# Patient Record
Sex: Female | Born: 1970
Health system: Southern US, Community
[De-identification: ages and names within clinical notes are randomized; demographics above are authoritative.]

## PROBLEM LIST (undated history)

## (undated) DIAGNOSIS — E079 Disorder of thyroid, unspecified: Secondary | ICD-10-CM

## (undated) DIAGNOSIS — L309 Dermatitis, unspecified: Secondary | ICD-10-CM

## (undated) DIAGNOSIS — E039 Hypothyroidism, unspecified: Secondary | ICD-10-CM

## (undated) HISTORY — PX: TONSILLECTOMY: SUR1361

## (undated) HISTORY — PX: THYROIDECTOMY: SHX17

## (undated) HISTORY — DX: Dermatitis, unspecified: L30.9

## (undated) HISTORY — DX: Disorder of thyroid, unspecified: E07.9

---

## 1998-06-25 ENCOUNTER — Emergency Department (HOSPITAL_COMMUNITY): Admission: EM | Admit: 1998-06-25 | Discharge: 1998-06-25 | Payer: Self-pay

## 1999-06-22 ENCOUNTER — Other Ambulatory Visit: Admission: RE | Admit: 1999-06-22 | Discharge: 1999-06-22 | Payer: Self-pay | Admitting: *Deleted

## 1999-07-12 ENCOUNTER — Encounter: Payer: Self-pay | Admitting: Obstetrics & Gynecology

## 1999-07-12 ENCOUNTER — Ambulatory Visit (HOSPITAL_COMMUNITY): Admission: RE | Admit: 1999-07-12 | Discharge: 1999-07-12 | Payer: Self-pay | Admitting: Obstetrics & Gynecology

## 1999-11-25 ENCOUNTER — Other Ambulatory Visit: Admission: RE | Admit: 1999-11-25 | Discharge: 1999-11-25 | Payer: Self-pay | Admitting: Obstetrics and Gynecology

## 1999-12-31 ENCOUNTER — Inpatient Hospital Stay (HOSPITAL_COMMUNITY): Admission: AD | Admit: 1999-12-31 | Discharge: 2000-01-04 | Payer: Self-pay | Admitting: *Deleted

## 2001-02-23 ENCOUNTER — Emergency Department (HOSPITAL_COMMUNITY): Admission: EM | Admit: 2001-02-23 | Discharge: 2001-02-23 | Payer: Self-pay | Admitting: Emergency Medicine

## 2001-02-23 ENCOUNTER — Encounter: Payer: Self-pay | Admitting: Emergency Medicine

## 2001-05-22 ENCOUNTER — Emergency Department (HOSPITAL_COMMUNITY): Admission: EM | Admit: 2001-05-22 | Discharge: 2001-05-22 | Payer: Self-pay | Admitting: Emergency Medicine

## 2001-05-27 ENCOUNTER — Encounter: Payer: Self-pay | Admitting: Emergency Medicine

## 2001-05-27 ENCOUNTER — Emergency Department (HOSPITAL_COMMUNITY): Admission: EM | Admit: 2001-05-27 | Discharge: 2001-05-27 | Payer: Self-pay | Admitting: Emergency Medicine

## 2001-12-05 ENCOUNTER — Other Ambulatory Visit: Admission: RE | Admit: 2001-12-05 | Discharge: 2001-12-05 | Payer: Self-pay | Admitting: Obstetrics and Gynecology

## 2002-06-18 ENCOUNTER — Emergency Department (HOSPITAL_COMMUNITY): Admission: EM | Admit: 2002-06-18 | Discharge: 2002-06-18 | Payer: Self-pay | Admitting: Emergency Medicine

## 2002-06-18 ENCOUNTER — Encounter: Payer: Self-pay | Admitting: Emergency Medicine

## 2002-12-08 ENCOUNTER — Emergency Department (HOSPITAL_COMMUNITY): Admission: EM | Admit: 2002-12-08 | Discharge: 2002-12-08 | Payer: Self-pay | Admitting: Emergency Medicine

## 2002-12-11 ENCOUNTER — Other Ambulatory Visit: Admission: RE | Admit: 2002-12-11 | Discharge: 2002-12-11 | Payer: Self-pay | Admitting: Obstetrics and Gynecology

## 2002-12-17 ENCOUNTER — Encounter: Admission: RE | Admit: 2002-12-17 | Discharge: 2002-12-17 | Payer: Self-pay | Admitting: Obstetrics and Gynecology

## 2002-12-17 ENCOUNTER — Encounter: Payer: Self-pay | Admitting: Obstetrics and Gynecology

## 2002-12-19 ENCOUNTER — Encounter: Payer: Self-pay | Admitting: Family Medicine

## 2002-12-19 ENCOUNTER — Encounter: Admission: RE | Admit: 2002-12-19 | Discharge: 2002-12-19 | Payer: Self-pay | Admitting: Family Medicine

## 2003-04-29 ENCOUNTER — Encounter (HOSPITAL_COMMUNITY): Admission: RE | Admit: 2003-04-29 | Discharge: 2003-07-28 | Payer: Self-pay | Admitting: Endocrinology

## 2003-05-01 ENCOUNTER — Encounter: Payer: Self-pay | Admitting: Endocrinology

## 2003-07-23 ENCOUNTER — Ambulatory Visit (HOSPITAL_COMMUNITY): Admission: RE | Admit: 2003-07-23 | Discharge: 2003-07-23 | Payer: Self-pay | Admitting: Family Medicine

## 2003-08-04 ENCOUNTER — Ambulatory Visit (HOSPITAL_COMMUNITY): Admission: RE | Admit: 2003-08-04 | Discharge: 2003-08-04 | Payer: Self-pay | Admitting: Otolaryngology

## 2003-08-04 ENCOUNTER — Encounter (INDEPENDENT_AMBULATORY_CARE_PROVIDER_SITE_OTHER): Payer: Self-pay | Admitting: *Deleted

## 2003-09-17 ENCOUNTER — Encounter (INDEPENDENT_AMBULATORY_CARE_PROVIDER_SITE_OTHER): Payer: Self-pay | Admitting: Specialist

## 2003-09-17 ENCOUNTER — Inpatient Hospital Stay (HOSPITAL_COMMUNITY): Admission: RE | Admit: 2003-09-17 | Discharge: 2003-09-18 | Payer: Self-pay | Admitting: Otolaryngology

## 2003-09-24 ENCOUNTER — Encounter: Admission: RE | Admit: 2003-09-24 | Discharge: 2003-09-24 | Payer: Self-pay | Admitting: Family Medicine

## 2003-12-21 ENCOUNTER — Other Ambulatory Visit: Admission: RE | Admit: 2003-12-21 | Discharge: 2003-12-21 | Payer: Self-pay | Admitting: Obstetrics and Gynecology

## 2004-01-06 ENCOUNTER — Encounter: Admission: RE | Admit: 2004-01-06 | Discharge: 2004-01-06 | Payer: Self-pay | Admitting: *Deleted

## 2004-12-23 ENCOUNTER — Other Ambulatory Visit: Admission: RE | Admit: 2004-12-23 | Discharge: 2004-12-23 | Payer: Self-pay | Admitting: Obstetrics and Gynecology

## 2005-10-13 ENCOUNTER — Encounter: Admission: RE | Admit: 2005-10-13 | Discharge: 2005-10-13 | Payer: Self-pay | Admitting: Family Medicine

## 2006-01-29 ENCOUNTER — Other Ambulatory Visit: Admission: RE | Admit: 2006-01-29 | Discharge: 2006-01-29 | Payer: Self-pay | Admitting: Obstetrics and Gynecology

## 2006-02-13 ENCOUNTER — Emergency Department (HOSPITAL_COMMUNITY): Admission: EM | Admit: 2006-02-13 | Discharge: 2006-02-13 | Payer: Self-pay | Admitting: Emergency Medicine

## 2006-06-15 ENCOUNTER — Encounter: Admission: RE | Admit: 2006-06-15 | Discharge: 2006-06-15 | Payer: Self-pay | Admitting: Obstetrics and Gynecology

## 2007-03-27 ENCOUNTER — Encounter: Admission: RE | Admit: 2007-03-27 | Discharge: 2007-03-27 | Payer: Self-pay | Admitting: Obstetrics and Gynecology

## 2007-06-25 ENCOUNTER — Encounter: Admission: RE | Admit: 2007-06-25 | Discharge: 2007-06-25 | Payer: Self-pay | Admitting: Emergency Medicine

## 2007-12-14 IMAGING — CR DG CHEST 2V
2 series · 2 of 2 positions shown · non-contrast
Comparison: none

CLINICAL DATA: Chest pain.
 CHEST X-RAY:
 Two views of the chest show the lungs to be clear.  There is peribronchial thickening which may indicate bronchitis.  The heart is within normal limits in size.

[w chest pa *]
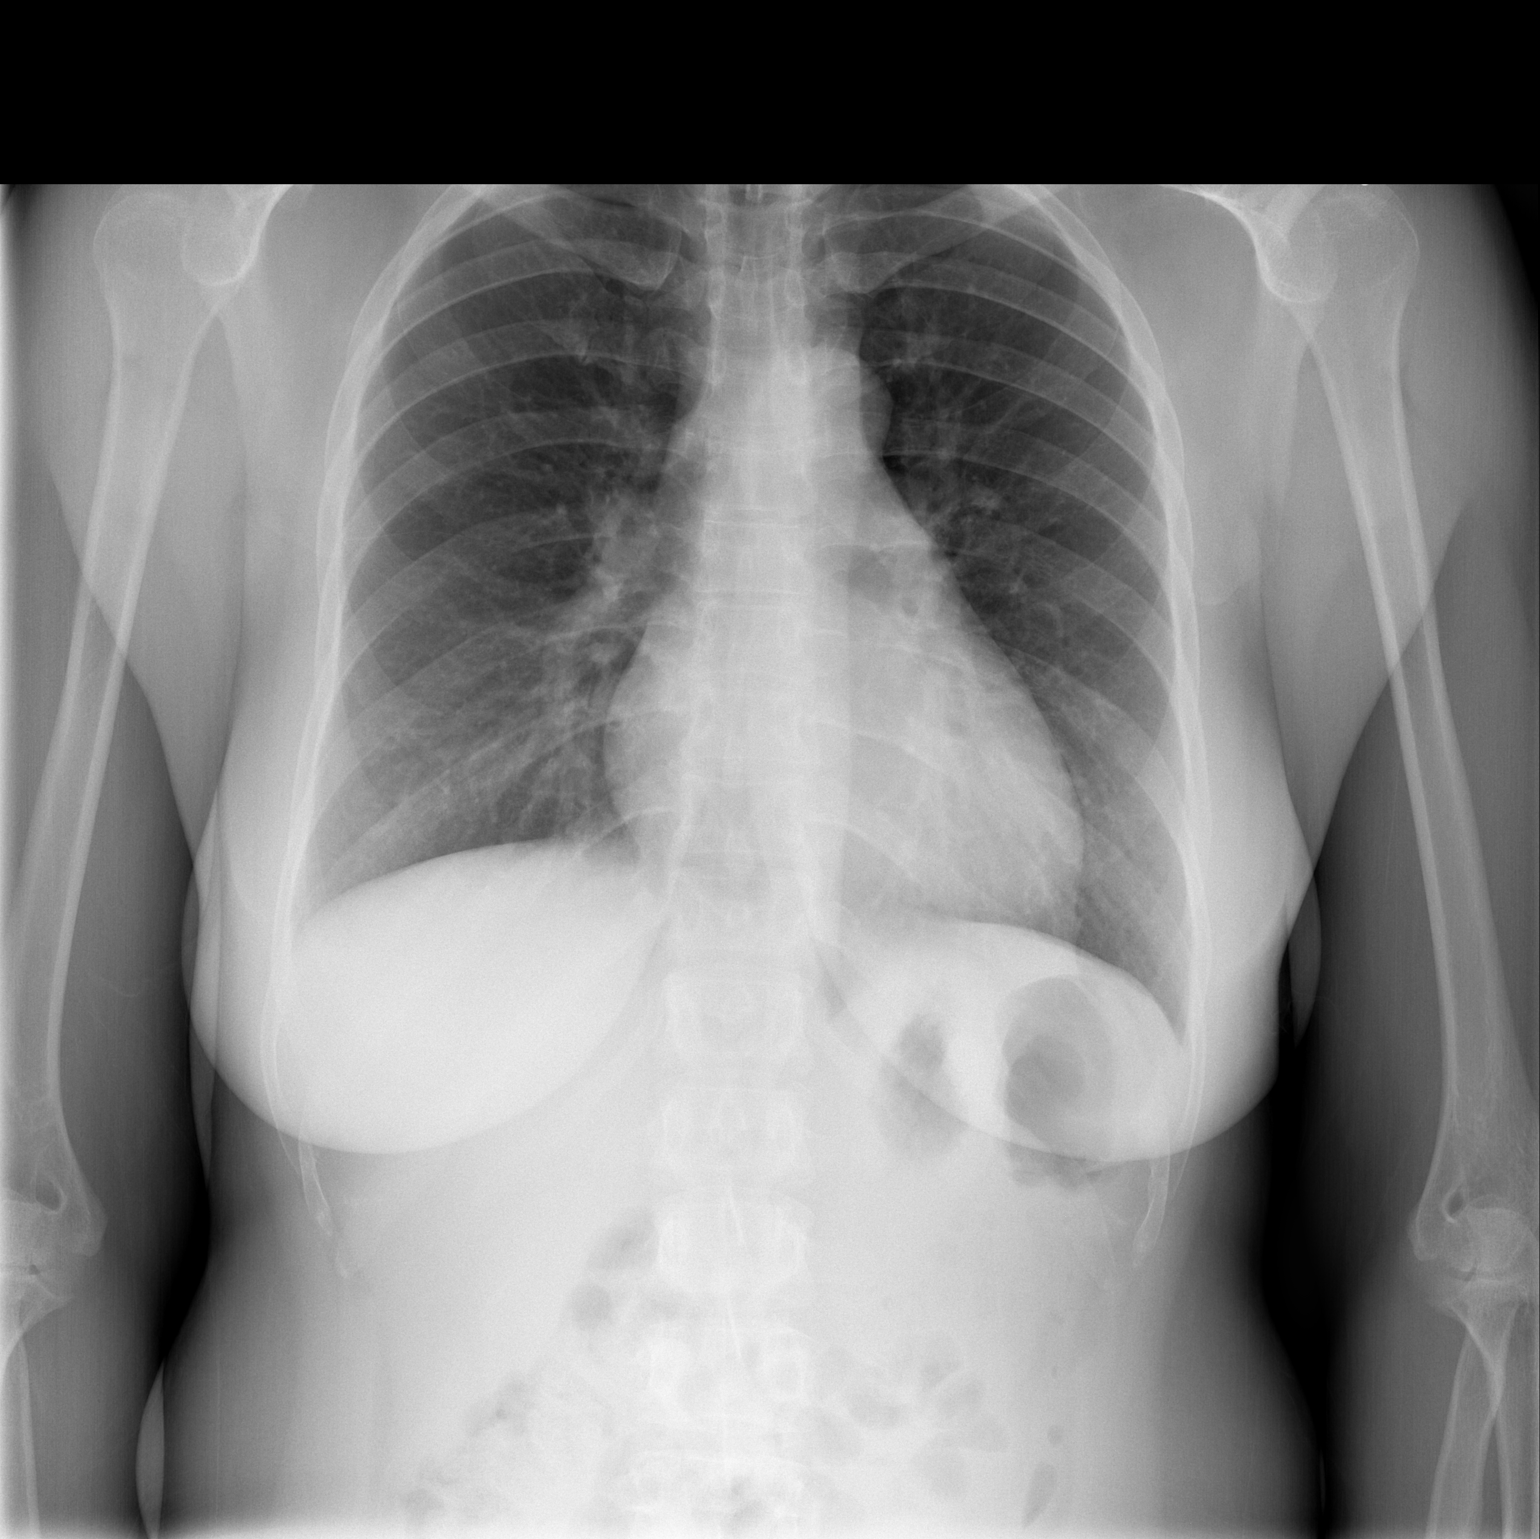

[w chest lat *]
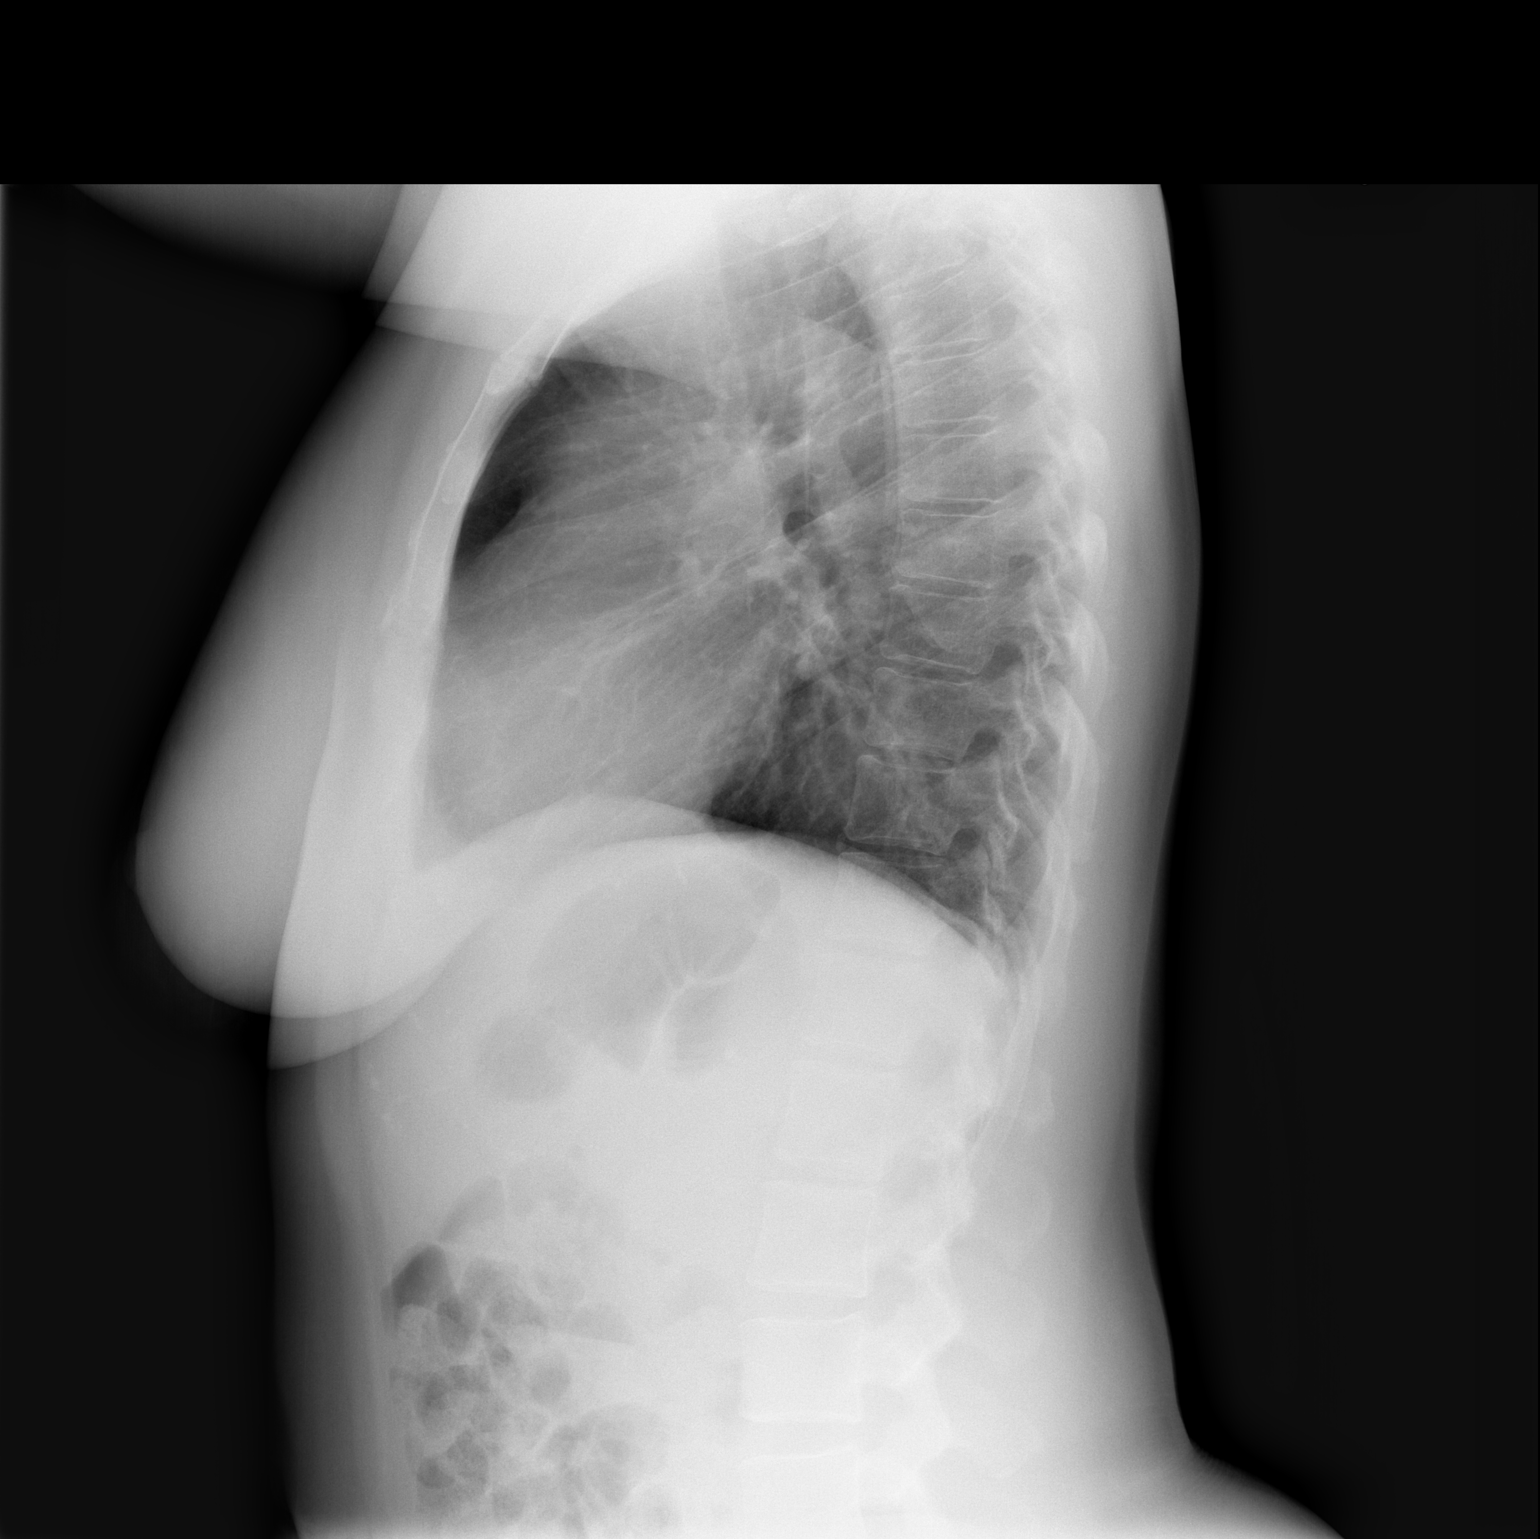

[2 of 2 positions shown; findings below may reference images not displayed]

IMPRESSION: No pneumonia.  Question bronchitis.

## 2008-04-22 ENCOUNTER — Encounter (INDEPENDENT_AMBULATORY_CARE_PROVIDER_SITE_OTHER): Payer: Self-pay | Admitting: Obstetrics and Gynecology

## 2008-04-22 ENCOUNTER — Inpatient Hospital Stay (HOSPITAL_COMMUNITY): Admission: AD | Admit: 2008-04-22 | Discharge: 2008-04-25 | Payer: Self-pay | Admitting: Obstetrics and Gynecology

## 2009-09-21 ENCOUNTER — Encounter: Admission: RE | Admit: 2009-09-21 | Discharge: 2009-09-21 | Payer: Self-pay | Admitting: Family Medicine

## 2010-04-05 ENCOUNTER — Encounter: Admission: RE | Admit: 2010-04-05 | Discharge: 2010-04-05 | Payer: Self-pay | Admitting: Obstetrics and Gynecology

## 2010-07-23 ENCOUNTER — Encounter: Payer: Self-pay | Admitting: Endocrinology

## 2010-07-24 ENCOUNTER — Encounter: Payer: Self-pay | Admitting: Obstetrics and Gynecology

## 2010-11-15 NOTE — Discharge Summary (Signed)
NAMECATHRYN, Sharon Moreno               ACCOUNT NO.:  [id number]   MEDICAL RECORD NO.:  [id number]          PATIENT TYPE:  INP   LOCATION:  9130                          FACILITY:  [facility]   PHYSICIAN:  Crist Fat. Rivard, M.D. DATE OF BIRTH:  02-01-1971   DATE OF ADMISSION:  04/22/2008  DATE OF DISCHARGE:  04/25/2008                               DISCHARGE SUMMARY   ADMISSION DIAGNOSES:  1. Intrauterine pregnancy at 38-3/7th weeks.  2. Vaginal bleeding, unknown etiology.  3. Previous cesarean section.  4. Tubal ligation.   DISCHARGE DIAGNOSES:  1. Intrauterine pregnancy at 38-3/7th weeks, delivered.  2. Desires sterilization.   PROCEDURES:  1. Repeat low-transverse cesarean section.  2. Bilateral tubal ligation.   HOSPITAL COURSE:  [name] is a 40 year old gravida 3, para 1-0-1-1  who was admitted at 38-3/7th weeks' gestation with vaginal bleeding.  They were 2 days prior to the day of admission.  At the time of office  visit prior to admission, clot was noted in the vagina.  1. The patient also with decreased fetal movement on the day of      admission as well.  Pregnancy remarkable for advanced maternal age.  2. Thyroid cancer and subsequent hypothyroidism.  3. Previous C-section for failure to progress.  4. Increased body mass index.  5. Desires bilateral tubal ligation.  6. Left breast fibroidectomy.   The patient was seen from the office for the above-noted issues.  At the  time of presentation MAU, moderate amount of bright red blood with  pooling in the vault was noted.  Fetal heart rate tracing was reactive  at that time of admission.  At that time, options for continued  observation, vaginal bleeding with admission or proceeding with cesarean  section discussed with the patient and Dr. Stefano Gaul.  At that time, the  patient desired to proceed with repeat cesarean section.  The patient  underwent a repeat low-transverse cesarean section and delivered a  viable female  infant, Rohan with a weight 6 pounds 11 ounces and Apgars of  8 and 9 at 1 and 5 minutes respectively.  No known etiology was noted at  the time of surgery for the vaginal bleeding noted prior to surgery.  Surgery was uncomplicated.  The patient also desired a bilateral tubal  ligation, which was performed as well.  Estimated blood loss was 700 mL.   On postoperative day #1, the patient was doing well and postoperative  hemoglobin noted to be 10.2.  On postoperative day #3, the patient was  ambulating, voiding, and tolerating p.o. liquids and solids without  difficulty.  [name] vital signs remained stable.  The patient was  afebrile.  It was felt the patient had received full benefit of her  hospital stay and was discharged to home.   DISCHARGE INSTRUCTIONS:  Per [facility] handout.   DISCHARGE MEDICATIONS:  1. Motrin 600 mg p.o. q.6 h. p.r.n. pain.  Of note, the patient stated      at one time that she was allergic to IBUPROFEN, but however, upon      further  questioning, the patient reports that she only experienced      nausea and has used ibuprofen in the past without other issues.  2. Tylox 1-2 tablets every 4-6 hours p.r.n. pain.   DISCHARGE FOLLOWUP:  Will occur at [facility] in 6 weeks.      Rhona Leavens, CNM      ______________________________  Crist Fat Rivard, M.D.    NOS/MEDQ  D:  04/25/2008  T:  04/25/2008  Job:  [id number]

## 2010-11-15 NOTE — H&P (Signed)
NAMEROGELIO, WINBUSH NO.:  [id number]   MEDICAL RECORD NO.:  [id number]          PATIENT TYPE:  MAT   LOCATION:  MATC                          [facility]ILITY:  [facility]   PHYSICIAN:  Janine Limbo, M.D.DATE OF BIRTH:  [birth year]   DATE OF ADMISSION:  04/22/2008  DATE OF DISCHARGE:                              HISTORY & PHYSICAL   Ms. Sharon Moreno is a 40 year old black female gravida 3, para 1-0-1-1, at 41  and 3/7 weeks for an [facility] of May 03, 2008, who was sent from the  office for complaint of vaginal bleeding over the last 2 days.  She  reports spotting with wiping yesterday and was seen in office for her  regularly schedule appointment yesterday with Dr. Stefano Gaul.  She  continued to have spotting with wiping yesterday evening.  When she  awoke this morning, there was no apparent vaginal bleeding when she used  the restroom.  As the day progressed today, the spotting returned, and  then she also noted some blood in the toilet.  She had not had any  vaginal bleeding that saturated her underwear until right before she was  worked in for an appointment at the office today.  She was seen in the  office by Theresia Lo, certified nurse midwife, and per her exam, there  was a clot the vagina and then red discharge on exam glove after  bimanual exam.  Her cervix was 1 cm, 50% and high.  The patient does  report decreased fetal movement today.  She is having occasional  contractions.  No leakage of fluid or watery discharge.  No other  complaints.  She has been followed by MD service at [facility].   HISTORY:  Remarkable for the following:  1. Advanced maternal age.  2. Thyroid cancer and subsequent hypothyroidism.  3. Previous C-section for failure to progress.  4. Increased BMI.  5. Desires bilateral tubal ligation.  6. Left breast fibroidectomy.   OBSTETRICAL HISTORY:  1. Gravida 1, C-section for failure to progress by Dr. Stefano Gaul, and      that was in July 2001, a female by  the name of Sharon Moreno weighing 8      pounds 8 ounces at 40 and 6/7 weeks after 10 hours of labor.  2. Gravida 2 was an elective abortion September 2006.  3. Gravida 3 is current pregnancy.   PAST MEDICAL HISTORY:  She does report an allergy to ASPIRIN.  Condoms  for birth control in the past.  She had a fibroadenoma that was removed  from her left breast at age 64.  Varicella as a child.  She reports  menarche at age 24 normal cycles, LMP August 04, 2007, giving her [facility]  of May 10, 2008.  However, her ultrasound on September 19, 2007, gave  best [facility] of May 03, 2008.  The patient had thyroid cancer at age 42  with subsequent thyroidectomy.  She also had tonsils and adenoids  removed at age 1.   FAMILY HISTORY:  Remarkable for maternal grandfather and cousin with  chronic hypertension.  Maternal aunts x2 and  maternal grandfather  diabetic, aunt on dialysis, cousin with seizures, genetic history  remarkable for patient age 89 and a cousin with mental retardation.   SOCIAL HISTORY:  Works full time in [employer], site, has had 16 years of  education, unaware father of baby's name; it not on her Optometrist.  The patient denied alcohol, tobacco or illicit drug use.   PRENATAL LABORATORIES:  Blood type is A positive, Rh antibody screen  negative.  Sickle cell negative, RPR nonreactive, rubella titer immune.  Hepatitis surface antigen negative, HIV nonreactive.  Cystic fibrosis  negative.  Hemoglobin October 10, 2007, was 12.1, hematocrit 36.3,  platelets 303.  Pap was within normal limits October 2008 and March  2009.  Gonorrhea and chlamydia cultures negative.  TSH at that time was  2.054.   HISTORY OF PRESENT PREGNANCY:  She entered care at [facility] October 10, 2007.  She was approximately 10 and 4/7 weeks' gestation.  She was  normotensive.  Her height was 5 feet 1 inch.  Her pregravid weight was  150 pounds.  At that time, her weight was 165 pounds.  She desired  originally Dr. Normand Sloop  as her primary, has now been followed by Dr.  Estanislado Pandy.  She did plan amnio secondary to advanced maternal age.  The  patient had the amnio scheduled for December 03, 2007.  Her endocrinologist,  Dr. Karen Kays, saw patient Nov 12, 2007.  Her TSH was 3.781.  T4 was 10.5.  At 18 and 2/7 weeks on December 03, 2007, she had an  ultrasound, single intrauterine pregnancy; size was equal to dates,  anterior placenta; adjusted risk was 1/590 from 1/295.  At that time,  the patient did decline amnio.  At that time, the patient also desired  repeat C-section as well as BTL.  She had a 1-hour Glucola at 26 and 3/7  weeks.  Hemoglobin at that time was 10.8.  Her weight at that time was  172-1/2 pounds, and plan was made for ultrasound for growth.  TSH was  drawn, and it was 1.59.  The 1-hour GTT was within normal limits equal  to 106.  She did have a low transverse cesarean section repeat scheduled  for May 01, 2008, at 9:30 a.m. with Dr. Stefano Gaul.  Hemoglobin was  drawn at 36 and 4/7 weeks on April 09, 2008.  Hemoglobin was 11.8.  TSH  was normal.  GBS negative.  She was 1 cm and 50% at 37 and 5/7 weeks;  see HPI for her most recent exams at the office leading to her  presentation today with the bleeding complaint from yesterday and today.   OBJECTIVE:  VITAL SIGNS ON ADMISSION:  Her blood pressure was 110/68,  heart rate 78, respirations 22.  Fetal heart rate reactive with moderate  variability, 130 baseline, very brief mild variable lasting less than 10  seconds with minimal nadir to 110.  Toco showed uterine contractions  every 7-12 minutes with irritability.  GENERAL:  No acute distress, alert and oriented x3, very pleasant.  CARDIOVASCULAR:  Regular rate and rhythm without murmur.  LUNGS:  Clear to auscultation bilaterally.  ABDOMEN:  Soft, nontender, gravid.  PELVIC:  Sterile spec exam showed small to moderate amount of bright to  dark red blood pooling in vault, not with mucus.  Her  cervix was 1+, 70%  and high.  EXTREMITIES:  She has mild generalized edema in bilateral lower  extremities.  No clonus and DTRs 1+.   IMPRESSION:  1. Intrauterine pregnancy at 80 and 3/7 weeks.  2. Third trimester vaginal bleeding.  3. Desire for repeat cesarean section and bilateral tubal ligation.  4. Reactive fetal heart tracing.   PLAN:  Dr. Stefano Gaul was at bedside and discussed with the patient  options of the following:  1. Admit for observation secondary to vaginal bleeding or #2.  2. Proceed with repeat cesarean for delivery.  The risks, benefits,      alternatives of both were discussed with the patient as far as risk      for cesarean section including but not limited to risk of bleeding,      infection and damage to      surrounding organs.  Following discussion with her family, patient      is agreeable to proceed with cesarean section for delivery and BTL.  3. Routine preop orders and will prepare for repeat cesarean section      and tubal.      Sharon Moreno, CNM      Janine Limbo, M.D.  Electronically Signed    CHS/MEDQ  D:  04/22/2008  T:  04/22/2008  Job:  [id number]

## 2010-11-15 NOTE — Op Note (Signed)
Sharon Moreno, ALPERIN NO.:  [id number]   MEDICAL RECORD NO.:  [id number]          PATIENT TYPE:  INP   LOCATION:  9130                          [facility]ACILITY:  [facility]   PHYSICIAN:  Janine Limbo, M.D.DATE OF BIRTH:  Dec 15, 1970   DATE OF PROCEDURE:  04/22/2008  DATE OF DISCHARGE:                               OPERATIVE REPORT   PREOPERATIVE DIAGNOSES:  1. Term intrauterine pregnancy.  2. Vaginal bleeding.  3. Prior cesarean section.  4. Desires sterilization.   POSTOPERATIVE DIAGNOSES:  1. Term intrauterine pregnancy.  2. Vaginal bleeding.  3. Prior cesarean section.  4. Desires sterilization.   PROCEDURE:  Repeat low transverse cesarean section and bilateral tubal  ligation.   SURGEON:  Janine Limbo, MD   FIRST ASSISTANT:  Candice Denny Levy, Certified Nurse Midwife.   ANESTHETIC:  Spinal.   DISPOSITION:  Ms. Sharon Moreno is a 40 year old female, gravida 3, para 1-0-1-  1, who presents at 38-1/[redacted] weeks gestation (EDC is May 03, 2008).  The patient has been followed at the [facility] and  Gynecology Division of [facility] for women.  This pregnancy  has been largely uncomplicated.  The patient does report that she has  had vaginal bleeding for the past 2-3 days.  She has presented to the  office for evaluation and indeed the bleeding is noted to be more than  would be expected for normal pregnancy changes.  No etiology is known  for the vaginal bleeding.  The nonstress test is reactive.  We discussed  our management options and the risk and benefits associated with each of  those options.  The patient is scheduled for repeat cesarean section in  8 days.  She carefully considered her options and she decided to proceed  with cesarean delivery at this time.  The patient understands the  indications for her surgical procedure and she accepts the risks, but  not limited to, anesthetic complications, bleeding, infection,  possible  damage to surrounding organs, and possible tubal failure (17 per 1000).   FINDINGS:  A 6 pounds and 11 ounces female infant (Sharon Moreno) was delivered  from a right occiput transverse presentation.  The Apgar scores were 8  at 1 minute and 9 at 5 minutes.  The uterus, fallopian tubes, and the  ovaries were normal for the gravid state.  There still no etiology known  for the vaginal bleeding.   PROCEDURE:  The patient was taken to the operating room where a spinal  anesthetic was given.  [name] abdomen, perineum, and outer vagina  were prepped with multiple layers of Betadine.  A Foley catheter was  placed in the bladder.  The patient was then sterilely draped.  The  lower abdomen was injected with 10 mL of 0.5% Marcaine with epinephrine.  A low transverse incision was made in the abdomen and the incision was  extended sharply through the subcutaneous tissue, fascia, and the  anterior peritoneum.  Large varicose veins were noted on the lower  uterine segment.  For that reason, we went slightly higher on the uterus  and  made a low transverse incision.  The fetal head was delivered.  The  mouth and nose were suctioned.  A nuchal cord was reduced.  The  remainder of the infant was then delivered.  The cord was clamped and  cut and the infant was handed to the waiting pediatric team.  Routine  cord blood studies were obtained.  The placenta was removed.  The  placenta was sent to labor and delivery.  The uterine cavity was cleaned  of amniotic fluid, clotted blood, and membranes.  The uterine incision  was closed using a running locking suture of 2-0 Vicryl followed by  imbricating suture of 2-0 Vicryl.  Hemostasis was noted to be adequate.  The pelvis was vigorously irrigated.  The left fallopian tube was  identified and followed to its fimbriated end.  A knuckle of tube was  made on the left using a free tie and then a suture ligature of 0 plain  catgut.  The knuckle of tube thus  made was excised.  Hemostasis was  adequate.  An identical procedure was carried out on the opposite side.  Again hemostasis was adequate.  All instruments were removed.  The  anterior peritoneum and abdominal musculature were closed using a  running suture of 0 Vicryl.  The fascia was closed using a running  suture of 0 Vicryl followed by three interrupted sutures of 0 Vicryl.  The subcutaneous layer was closed using a running suture of 0 Vicryl.  The skin was reapproximated using a subcuticular suture of 3-0 Monocryl.  Sponge, needle, instrument counts were correct on two occasions.  Estimated blood loss for the procedure was 700 mL.  The patient  tolerated her procedure well.  She was taken to the recovery room in  stable condition.  The infant was taken to the full-term nursery in  stable condition.  The fallopian tubes that were cut, were sent to  pathology for evaluation.      Janine Limbo, M.D.  Electronically Signed     AVS/MEDQ  D:  04/22/2008  T:  04/23/2008  Job:  [id number]

## 2010-11-18 NOTE — Op Note (Signed)
[facility]  Patient:    Sharon Moreno, Sharon Moreno                        MRN: [id number] Proc. Date: 01/01/00 Adm. Date:  [id number] Attending:  Leonard Schwartz                           Operative Report  PREOPERATIVE DIAGNOSES:       1. Term intrauterine pregnancy.                               2. Failure to progress in labor.                               3. Non-reassuring fetal heart rate tracing.  POSTOPERATIVE DIAGNOSES:      1. Term intrauterine pregnancy.                               2. Failure to progress in labor.                               3. Non-reassuring fetal heart rate tracing.  PROCEDURE:                    Primary low transverse cesarean section.  SURGEON:                      Janine Limbo, M.D.  FIRST ASSISTANT:              Nigel Bridgeman, C.N.M.  ANESTHESIA:                   Epidural.  DISPOSITION:                  Sharon Moreno is a 40 year old female, gravida 1, para 0, who presents at 40 weeks and 5 days gestation.  She started her labor on December 31, 1999 and dilated her cervix to 8 cm.  She progressed no further in spite of Pitocin, maintaining greater than 200 [place] units for approximately three hours.  The patient understands the indications for her procedure and she accepts the risks of, but not limited to, anesthetic complications, bleeding, infections and possible damage to the surrounding organs.  The patient began having deep variable decelerations as we prepared for cesarean delivery.  FINDINGS:                     An 8-pound 8-ounce female infant Sharon Moreno) was delivered from a left occipitotransverse position.  The Apgars were 5 at one minute and 9 at five minutes.  The uterus, fallopian tubes and ovaries appeared normal for the gravid state.  DESCRIPTION OF PROCEDURE:     The patient was taken to the operating room where it was determined that the epidural she had received for labor would be adequate  for a cesarean delivery.  A Foley catheter had previously been placed.  The patients abdomen was prepped with multiple layers of Betadine and then sterilely draped.  A low transverse incision was made in the abdomen and carried sharply through the subcutaneous tissue, the fascia and the anterior peritoneum.  An  incision was made in the lower uterine segment and extended transversely.  The fetal head was delivered.  The mouth and nose were suctioned.  The remainder of the infant was delivered and the infant was handed to the awaiting pediatric team.  Routine cord blood studies were obtained.  The placenta was removed.  The uterine cavity was cleaned of amniotic fluid, clotted blood and membranes.  The uterine cavity was irrigated.  The incision was closed using a running-locking suture of 2-0 Vicryl.  Hemostasis was adequate.  The paracolic gutters were cleaned of amniotic fluid and clotted blood.  The paracolic gutters were vigorously irrigated.  Hemostasis was confirmed once again.  The anterior peritoneum and the abdominal musculature were reapproximated in the midline.  The fascia was irrigated.  The fascia was closed using a running suture of 0 Vicryl, followed by three interrupted sutures of 0 Vicryl.  The subcutaneous tissue was irrigated.  Hemostasis was adequate.  The subcutaneous layer was closed using a running suture of 2-0 Vicryl.  The skin was reapproximated using skin staples.  Sponge, needle and instrument counts were correct on two occasions. The estimated blood loss was 600 cc.  The patient tolerated her procedure well.  She was taken to the recovery room in stable condition.  The infant was taken to the full-term nursery in stable condition. DD:  01/01/00 TD:  01/02/00 Job: [id number]

## 2010-11-18 NOTE — H&P (Signed)
[facility]  Patient:    Sharon Moreno, Sharon Moreno                        MRN: [id number] Adm. Date:  12/31/99 Attending:  Janine Limbo, M.D. Dictator:   Erin Sons, C.N.M.                         History and Physical  HISTORY OF PRESENT ILLNESS:   Sharon Moreno is a 40 year old, gravida 1, para 0, t 40-5/7 weeks who presents with uterine contractions every two to three minutes or the last several hours.  She was seen in the office yesterday and was 2 cm and er membranes were stripped.  She appears to be very uncomfortable with her contractions.  Pregnancy has been remarkable for:  1) History of thyroid cancer  with the patient on Synthroid during her pregnancy.  2) UTIs.  3) Left breast masses.  4) History of abnormal Pap with a plan to repeat the Pap postpartum.  PRENATAL LABORATORY DATA:     Blood type is A positive, rh antibody negative, VDRL nonreactive, rubella titer positive, hepatitis B surface antigen negative, HIV nonreactive, sickle cell test negative.  Pap showed low grade SIL with yeast. SH was done q.trimester.  First trimester was 18.39, second trimester 10.777, third trimester not documented on the patients current prenatal record.  Group B Strep culture was negative at 36 weeks.  Hemoglobin upon entry into practice was 11.8. It was 10 at 26 weeks.  EDC of December 26, 1999, was established by ultrasound at approximately 18 weeks.  HISTORY OF PRESENT PREGNANCY: The patient entered care at approximately 13 weeks. She had a left breast nodule noted.  She had an ultrasound of the left breast. She had a colposcopy at 17 weeks which showed no evidence of high grade SIL.  Pap was repeated at 30 weeks and a plan to be made to repeat it postpartum.  The patient has been followed by Gretta Arab. Valentina Lucks, M.D. for TSH levels.  Synthroid was managed by her.  She had the left breast mass evaluated and was determined to be a fibroadenoma.  The  rest of the patients pregnancy was essentially uncomplicated.  PAST OBSTETRIC HISTORY:       The patient is a primigravida.  PAST MEDICAL HISTORY:         She had thyroid cancer diagnosed in 1998 and her thyroid was removed at that time.  She has a history of varicose veins.  She had asthma as a teenager, but has had no problems since that time.  She is hypothyroid after the removal of her thyroid gland.  She had one urinary tract infection in  December.  PAST SURGICAL HISTORY:        Her tonsils removed in third grade. Thyroidectomy in 1998.  ALLERGIES:                    ASPIRIN.  FAMILY HISTORY:               Maternal grandfather had hypertension.  Her maternal grandfather had type 2 diabetes.  Maternal aunt had type 2 diabetes.  Maternal grandmother had kidney failure.  Maternal cousin had seizures.  Maternal grandfather had arthritis.  GENETIC HISTORY:              Remarkable for maternal cousin being mentally retarded.  SOCIAL HISTORY:  The patient is single.  The father of the baby is not currently present with her.  The patient is college educated.  She is employed Teacher, adult education.  Her partner is a IT trainer.  She has been followed by the physician service at [facility] Gynecology.  She is African-American and of he [address] faith.  She denies any alcohol, drug, or tobacco use during this pregnancy.  PHYSICAL EXAMINATION:  VITAL SIGNS:                  Stable.  The patient is afebrile.  HEENT:                        Within normal limits.  LUNGS:                        Bilateral breath sounds are clear.  HEART:                        Regular rate and rhythm without murmur.  BREASTS:                      Soft and nontender.  ABDOMEN:                      Fundal height is approximately 38 cm.  Estimated fetal weight is 7 to 7-1/2 pounds.  Uterine contractions are every five minutes of moderate quality.  PELVIC:                        Cervical examination 3 to 4 cm, 90%, and vertex at -2 to -3 station with intact bag of water.  Fetal heart rate is reassuring, but nonreactive with occasional mild variables.  EXTREMITIES:                  Deep tendon reflexes are 2+ without clonus. There is a trace edema noted.  IMPRESSION:                   1. Intrauterine pregnancy at 40-5/7 weeks.                               2. Early labor.                               3. History of thyroid cancer.  PLAN:                         1) Admit to birthing suite per consult with Janine Limbo, M.D. as attending physician.  2) Routine physician orders.  3) The patient may Stadol or an epidural p.r.n.  4) Anticipate normal spontaneous vaginal delivery.  5) M.D. will follow. DD:  12/31/99 TD:  12/31/99 Job: [id number]

## 2010-11-18 NOTE — Op Note (Signed)
NAME:  Sharon Moreno, Sharon Moreno                         ACCOUNT NO.:  [id number]   MEDICAL RECORD NO.:  [id number]                   PATIENT TYPE:  INP   LOCATION:  5705                                 FACILITY:  [facility]   PHYSICIAN:  Kinnie Scales. Annalee Genta, M.D.            DATE OF BIRTH:  30-Nov-1970   DATE OF PROCEDURE:  09/17/2003  DATE OF DISCHARGE:  09/18/2003                                 OPERATIVE REPORT   PREOPERATIVE DIAGNOSES:  1. 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  2. History of thyroid carcinoma.  3. Status post total thyroidectomy in 1999.   POSTOPERATIVE DIAGNOSES:  1. 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  2. History of thyroid carcinoma.  3. Status post total thyroidectomy in 1999.   PROCEDURE:  Left paratracheal dissection.   SURGEON:  Kinnie Scales. Annalee Genta, M.D.   ASSISTANTEnrigue Catena H. Pollyann Kennedy, M.D.   ANESTHESIA:  General endotracheal with NIMS monitoring.   INDICATIONS:  1. 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  2. History of thyroid carcinoma.  3. Status post total thyroidectomy in 1999.   ESTIMATED BLOOD LOSS:  50 mL.   COMPLICATIONS:  None.   DISPOSITION:  The patient was transferred from the operating room to the  recovery room in stable condition.   INDICATIONS:  Sharon Moreno is a 40 year old black female who was referred by  her endocrinologist, Dr Talmage Nap, for evaluation of possible recurrent  papillary carcinoma of the thyroid.  The patient had undergone a total  thyroidectomy in 1999.  Subsequently, she was followed closely by her  endocrinologist and treated with radioactive iodine therapy without  significant uptake.  Over the last six months, the patient has had gradually  increasing thyroglobulin levels.  Ultrasound of the neck showed a possible  0.5 cm masses along the left peritracheal region consistent with current  thyroid malignancy.  Given the patient's history and examination, ultrasound  guided  needle biopsy was performed.  Biopsy results showed rare atypical  cells suspicious for recurrent papillary carcinoma.  Given that history and  examination which showed no evidence of palpable mass or abnormality, I  recommended to undertake an exploration of the neck and dissection of the  left paratracheal region and possible zone 6 neck dissection.  The risks,  benefits and possible complications of the procedure were discussed in  detail with the patient and her family and they understood and concurred  with our plan for surgery which is scheduled on September 17, 2003.   DESCRIPTION OF PROCEDURE:  The patient was brought to the operating room on  September 17, 2003, and placed in the supine position on the operating table.  General endotracheal anesthesia was established without difficulty using a  Zomed NIMS endotracheal tube (nerve integrity monitoring system).  With the  patient adequately anesthetized, she was injected with 3 mL of 1% lidocaine  1:100,000 solution epinephrine in the proposed incision site.  The patient  was then prepped and draped in a sterile fashion.  A 5 cm horizontally-  oriented low neck incision was created with a #15 scalpel blade.  This was  carried through the patient's previously existing thyroid incision.  The  skin and subcutaneous tissue were incised and subcutaneous flaps were  elevated superiorly and inferiorly.  The latissimus  muscle was identified  and divided, and subplatysmal flaps were elevated in order to gain access to  the anterior neck.  The cricoid and tracheal cartilage were palpated and  midline was identified.  There was significant scarring from previous  surgery.  The left paratracheal region was then explored.  Adherent strap  muscles were elevated from the trachea and this allowed access to the left  tracheal esophageal groove.  After dissecting carefully in that area an  inferior parathyroid gland was identified.  This was confirmed with  biopsy  leaving the majority of the gland intact.  The recurrent laryngeal nerve was  identified, and this was confirmed using the NIMS monitor.  This was  followed inferiorly and superiorly dissecting from the surrounding tissues.  With the nerve and parathyroid gland identified, dissection was then carried  out in the left paratracheal region.  There was no discrete palpable mass or  other abnormality.  Dissection was carried out between the trachea and the  medial aspect of the carotid artery.  An approximately 2 x 3 cm region of  tissue was then dissected.  Deep to that area, the parapharyngeal and  superior esophageal musculature was identified and preserved.  Dissection  involved the paratracheal tissues and these were sent for pathology for  gross microscopic evaluation.  [name] wound was then irrigated with  saline solution.  Several areas of point hemorrhage were cauterized with  bipolar cautery.  At the conclusion of the procedure, the recurrent  laryngeal nerve was stimulated and was intact.  The wound was then closed in  multiple layers consisting of reapproximation of the strap muscles with 3-0  Vicryl suture.  The same suture was used to close the platysmal on the left  hand side.  Subcutaneous tissue was closed with a 4-0 Vicryl suture and  final skin closure with a running locked 5-0 Ethilon suture.  The patient  was then awakened from her anesthetic.  She was extubated and then  transferred from the operating room to the recovery room in stable  condition.  There were no complications.  Estimated blood loss was  approximately 50 mL.                                               Kinnie Scales. Annalee Genta, M.D.    DLS/MEDQ  D:  16/04/9603  T:  09/19/2003  Job:  [id number]   cc:   Dorisann Frames, M.D.  [address] [place] [address]  Fax: [phone]

## 2010-11-18 NOTE — Discharge Summary (Signed)
[facility]  Patient:    Sharon Moreno, Sharon Moreno                        MRN: [id number] Adm. Date:  [id number] Disc. Date: 01/04/00 Attending:  Leonard Schwartz Dictator:   Miguel Dibble, C.N.M.                           Discharge Summary  ADMISSION DIAGNOSES:          1. Intrauterine pregnancy at term.                               2. Early labor.                               3. Negative Group B Strep.                               4. History of thyroid cancer.                               5. Right and left breast masses.                               6. History of abnormal Pap smears.  DISCHARGE DIAGNOSES:          1. Intrauterine pregnancy at term.                               2. Early labor.                               3. Negative Group B Strep.                               4. History of thyroid cancer.                               5. Right and left breast masses.                               6. History of abnormal Pap smears.                               7. Variable decellerations, nonreassuring fetal heart                                  tones.                               8. Delivered viable babyboy by cesarean section,  Apgars 5 and 9, weight 8 pounds 8 ounces.                               9. Failure to progress.                              10. Anemia.  PROCEDURE:                    Amniotomy, Pitocin augmentation, internal fetal monitoring, and epidural anesthesia.  Low transverse cesarean section.  HOSPITAL COURSE:              On June 30, Tejal Monroy was admitted at 3 to 4 m, 90% effaced, and vertex at -2 to -3 with intact bag of waters at 9:15 a.m.   By  2:30 she had progressed to 5 cm and 90% effaced and amniotomy was performed with clear fluid.  Fetal heart rate was reassuring.  By 9:35 p.m. she had progressed to 8 cm, vertex at -1.  Later on, Pitocin augmentation was initiated for  failure to progress.  By 1:20 a.m. on July 1, her [place] units were greater than 200 for two hours, but she was still 8 cm with no descent with pushing.  Risks and benefits were discussed regarding a cesarean section.  The decision was made to proceed ith a cesarean section for failure to progress and nonreassuring fetal heart rate tracing.  She had a primary low transverse cesarean section under epidural anesthesia delivering an 8 pound 8 ounce babyboy, Brandon, Apgars 5 and 9.  On postoperative day #1, on July 2, she was recovering satisfactorily except for a  temperature of 101.1 immediately postoperative.  The following hours, however, he was afebrile.  Hemoglobin dropped from 12.4 to 9.9.  She was breastfeeding successfully and desired Micronor for contraception.  She was ambulating and passing flatus.  On postoperative day #2 she was voiding well, however, having ome difficulty passing flatus with abdominal distention which appeared to have been  relieved by a Fleet enema.  She had good bowel sounds.  On postoperative day #3, her incision was clean, dry, and intact.  Fundus firm.  She was slightly distended, but feeling much more comfortable.  Her staples were removed, Steri-Strips applied, and she was discharged home in stable condition.  DISCHARGE INSTRUCTIONS:       Per [facility]. Follow up in six weeks at [facility].  DISCHARGE MEDICATIONS:        1. Tylox.                               2. Motrin.                               3. Micronor.                               4. Prenatal vitamins.                               5. Over-the-counter iron. DD:  01/04/00 TD:  01/04/00 Job: [id number]

## 2010-11-18 NOTE — Discharge Summary (Signed)
NAME:  Sharon Moreno, Sharon Moreno                         ACCOUNT NO.:  [id number]   MEDICAL RECORD NO.:  [id number]                   PATIENT TYPE:  INP   LOCATION:  5705                                 FACILITY:  [facility]   PHYSICIAN:  Kinnie Scales. Annalee Genta, M.D.            DATE OF BIRTH:  07/04/1970   DATE OF ADMISSION:  09/17/2003  DATE OF DISCHARGE:  09/18/2003                                 DISCHARGE SUMMARY   ADMISSION DIAGNOSES:  1. Left paratracheal mass.  2. 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  3. Status post total thyroidectomy for papillary carcinoma (1999).   DISCHARGE DIAGNOSES:  1. Left paratracheal mass.  2. 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  3. Status post total thyroidectomy for papillary carcinoma (1999).   PROCEDURE THIS ADMISSION:  Left paratracheal lymph node dissection (September 17, 2003).   The patient is discharged to home in stable condition in the company of her  family.   DISCHARGE MEDICATIONS:  1. Percocet 5/325 one to two p.o. q.4-6h. p.r.n. #30 without refill.  2. Augmentin 500 one p.o. b.i.d. for 10 days.  3. She will continue on her preoperative Synthroid one tablet p.o. daily.   PAIN MANAGEMENT:  As above.   ACTIVITY:  Limited.  No lifting or straining.   DIET:  No dietary restrictions.   WOUND CARE:  Half strength hydrogen peroxide and Bacitracin ointment twice  daily.   FOLLOW UP:  The patient will follow up in my office in one week for  postoperative care.   HISTORY OF PRESENT ILLNESS:  Ms. Podgorski is a 40 year old black female who  was referred by her endocrinologist, Dr. Talmage Nap, for evaluation of possible  recurrent papillar carcinoma of the thyroid.  The patient had undergone  prior total thyroidectomy in 1999 and post surgical radioactive iodine  therapy.  The patient had been followed closely through Dr. Willeen Cass office  and over the last four to six months, had gradually increasing levels of  thyroglobulin.  Ultrasound evaluation showed possible masses in the  paratracheal region and concerns were raised regarding possible recurrent  thyroid carcinoma.  Ultrasound guided needle biopsy was performed and  findings included atypical cells.  Given this history, I recommended he  undertake a paratracheal dissection in order to confirm diagnosis and remove  any residual disease.  The risks, benefits and possible complications were  discussed with the patient and her family and they understood and concurred  with our plan for surgery.   HOSPITAL COURSE:  The patient was admitted to the AT service under Dr.  Thurmon Fair care at [facility] on September 17, 2003.  She  was taken to the main operating room and under general endotracheal  anesthesia, a left paratracheal dissection was carried out.  Surgery was  performed without complications or difficulty and the patient was  transferred from the operating room to the recovery room in stable  condition.  From recovery, she was transferred at 5700 for post surgical  care.   [name] post surgical course was uneventful.  She was monitored  carefully on the floor. She had normal voicing without stridor and no  respiratory difficulty.  She tolerated a normal oral diet without  difficulty.  Normal ambulation, normal bowel and bladder function.  Pain was  well controlled with her prescribed Percocet.  [name] initial calcium  levels were normal.  No evidence of immediate postoperative hypocalcemia.  The patient was seen on the first postoperative morning.  Her incision was  dry and intact.  No swelling or erythema and she was discharged to home in  stable condition with the above discharge medications.  She will return to  see me in one week for suture removal and post surgical care, or sooner if  warranted.  She was instructed to monitor for symptoms of hypocalcemia and  will contact our office if these symptoms  occur.  The patient and her mother  are comfortable with her discharge planning and will follow up with me in  one week as outlined above.                                                Kinnie Scales. Annalee Genta, M.D.    DLS/MEDQ  D:  16/04/9603  T:  09/21/2003  Job:  [id number]   cc:   Dorisann Frames, M.D.  [address] [place] [address]  Fax: [phone]

## 2011-04-04 LAB — CBC
HCT: 30.2 — ABNORMAL LOW
HCT: 37.4
Hemoglobin: 10.2 — ABNORMAL LOW
Hemoglobin: 12.6
MCHC: 33.7
MCHC: 33.7
MCV: 90.2
MCV: 90.8
Platelets: 198
RBC: 4.15
RDW: 13.6

## 2011-04-04 LAB — RPR: RPR Ser Ql: NONREACTIVE

## 2011-10-23 ENCOUNTER — Ambulatory Visit: Payer: Self-pay | Admitting: Obstetrics and Gynecology

## 2011-10-31 ENCOUNTER — Ambulatory Visit (INDEPENDENT_AMBULATORY_CARE_PROVIDER_SITE_OTHER): Payer: 59 | Admitting: Obstetrics and Gynecology

## 2011-10-31 ENCOUNTER — Encounter: Payer: Self-pay | Admitting: Obstetrics and Gynecology

## 2011-10-31 VITALS — BP 102/70 | Resp 16 | Ht 61.0 in | Wt 176.0 lb

## 2011-10-31 DIAGNOSIS — Z124 Encounter for screening for malignant neoplasm of cervix: Secondary | ICD-10-CM

## 2011-10-31 DIAGNOSIS — E890 Postprocedural hypothyroidism: Secondary | ICD-10-CM | POA: Insufficient documentation

## 2011-10-31 DIAGNOSIS — E039 Hypothyroidism, unspecified: Secondary | ICD-10-CM

## 2011-10-31 DIAGNOSIS — B009 Herpesviral infection, unspecified: Secondary | ICD-10-CM | POA: Insufficient documentation

## 2011-10-31 DIAGNOSIS — Z01419 Encounter for gynecological examination (general) (routine) without abnormal findings: Secondary | ICD-10-CM

## 2011-10-31 DIAGNOSIS — E669 Obesity, unspecified: Secondary | ICD-10-CM | POA: Insufficient documentation

## 2011-10-31 MED ORDER — VALACYCLOVIR HCL 500 MG PO TABS
500.0000 mg | ORAL_TABLET | Freq: Every day | ORAL | Status: AC
Start: 2011-10-31 — End: 2011-11-30

## 2011-10-31 NOTE — Progress Notes (Signed)
Regular Periods: yes Mammogram: 04/05/2010  Monthly Breast Ex.: yes Exercise: yes  Tetanus < 10 years: yes Seatbelts: yes  NI. Bladder Functn.: yes Abuse at home: no  Daily BM's: yes Stressful Work: yes  Healthy Diet: yes Sigmoid-Colonoscopy: per pt NONE  Calcium: no Medical problems this year: pt states she has tenderness on Left breast that is at times painful to touch and gets sore.   LAST PAP:03/16/2010  Contraception: BTL  Mammogram:  04/05/2010 "WNL"  PCP: Dr.ALM  PMH: No changes per pt.  FMH: No changes per pt.  Subjective:    Sharon Moreno is a 41 y.o. female, No obstetric history on file., who presents for an annual exam. See above. History of herpes virus II. Stress at work.  Prior Hysterectomy: No    History   Social History  . Marital Status: Single    Spouse Name: N/A    Number of Children: N/A  . Years of Education: N/A   Social History Main Topics  . Smoking status: Never Smoker   . Smokeless tobacco: None  . Alcohol Use: No  . Drug Use: No  . Sexually Active: None   Other Topics Concern  . None   Social History Narrative  . None    Menstrual cycle:   LMP: Patient's last menstrual period was 10/19/2011.           Cycle: Regular, monthly with normal flow and no severe dysmenorrha  The following portions of the patient's history were reviewed and updated as appropriate: allergies, current medications, past family history, past medical history, past social history, past surgical history and problem list.  Review of Systems Pertinent items are noted in HPI. Breast:Negative for breast lump,nipple discharge or nipple retraction Gastrointestinal: Negative for abdominal pain, change in bowel habits or rectal bleeding Urinary:negative   Objective:    BP 102/70  Resp 16  Ht 5\' 1"  (1.549 m)  Wt 176 lb (79.833 kg)  BMI 33.25 kg/m2  LMP 10/19/2011    Weight:  Wt Readings from Last 1 Encounters:  10/31/11 176 lb (79.833 kg)          BMI: Body  mass index is 33.25 kg/(m^2).  General Appearance: Alert, appropriate appearance for age. No acute distress HEENT: Grossly normal Neck / Thyroid: Supple, no masses, nodes or enlargement Lungs: clear to auscultation bilaterally Back: No CVA tenderness Breast Exam: No masses or nodes.No dimpling, nipple retraction or discharge. Cardiovascular: Regular rate and rhythm. S1, S2, no murmur Gastrointestinal: Soft, non-tender, no masses or organomegaly  ++++++++++++++++++++++++++++++++++++++++++++++++++++++++  Pelvic Exam: External genitalia: normal general appearance Vaginal: normal without tenderness, induration or masses and relaxation noted Cervix: normal appearance Adnexa: normal bimanual exam Uterus: upper limits normal size Rectovaginal: normal rectal, no masses  ++++++++++++++++++++++++++++++++++++++++++++++++++++++++  Lymphatic Exam: Non-palpable nodes in neck, clavicular, axillary, or inguinal regions Neurologic: Normal speech, no tremor  Psychiatric: Alert and oriented, appropriate affect.   Wet Prep:not applicable Urinalysis:not applicable UPT: Not done   Assessment:    Normal gyn exam   Herpes virus II  Hypothyroidism  Overweight or obese: Yes   Pelvic relaxation: Yes   Plan:    mammogram return annually or prn Contraception:bilateral tubal ligation    STD screen request: none RPR: No.  HBsAg: No. Hepatitis C: No.  The updated Pap smear screening guidelines were discussed with the patient. The patient requested that I obtain a Pap smear: No.  Kegel exercises discussed: Yes.  Proper diet and regular exercise were reviewed.  Annual  mammograms recommended starting at age 19. Proper breast care was discussed.  Screening colonoscopy is recommended beginning at age 43.  Regular health maintenance was reviewed.  Sleep hygiene was discussed.  Adequate calcium and vitamin D intake was emphasized.  Mylinda Latina.D.

## 2011-12-22 ENCOUNTER — Other Ambulatory Visit: Payer: Self-pay | Admitting: Obstetrics and Gynecology

## 2011-12-22 DIAGNOSIS — Z1231 Encounter for screening mammogram for malignant neoplasm of breast: Secondary | ICD-10-CM

## 2011-12-29 ENCOUNTER — Ambulatory Visit
Admission: RE | Admit: 2011-12-29 | Discharge: 2011-12-29 | Disposition: A | Discharge status: Home / Self Care | Payer: Self-pay | Source: Ambulatory Visit | Attending: Obstetrics and Gynecology | Admitting: Obstetrics and Gynecology

## 2011-12-29 DIAGNOSIS — Z1231 Encounter for screening mammogram for malignant neoplasm of breast: Secondary | ICD-10-CM

## 2012-01-08 ENCOUNTER — Encounter: Payer: Self-pay | Admitting: Obstetrics and Gynecology

## 2013-01-28 ENCOUNTER — Other Ambulatory Visit (INDEPENDENT_AMBULATORY_CARE_PROVIDER_SITE_OTHER): Payer: 59

## 2013-01-28 ENCOUNTER — Other Ambulatory Visit: Payer: Self-pay

## 2013-01-28 ENCOUNTER — Other Ambulatory Visit: Payer: Self-pay | Admitting: *Deleted

## 2013-01-28 DIAGNOSIS — E890 Postprocedural hypothyroidism: Secondary | ICD-10-CM

## 2013-01-30 ENCOUNTER — Encounter: Payer: Self-pay | Admitting: Endocrinology

## 2013-01-30 ENCOUNTER — Ambulatory Visit (INDEPENDENT_AMBULATORY_CARE_PROVIDER_SITE_OTHER): Payer: 59 | Admitting: Endocrinology

## 2013-01-30 VITALS — BP 112/70 | HR 73 | Temp 98.9°F | Resp 10 | Ht 61.0 in | Wt 168.3 lb

## 2013-01-30 DIAGNOSIS — E039 Hypothyroidism, unspecified: Secondary | ICD-10-CM

## 2013-01-30 DIAGNOSIS — C73 Malignant neoplasm of thyroid gland: Secondary | ICD-10-CM

## 2013-01-30 NOTE — Patient Instructions (Addendum)
No change 

## 2013-01-30 NOTE — Progress Notes (Signed)
Patient ID: Sharon Moreno, female   DOB: 05-31-1971, 42 y.o.   MRN: [id number]  Reason for Appointment:  Hypothyroidism, followup visit    History of Present Illness:   Hypothyroidism:   The hypothyroidism was first diagnosed In 1998 after surgery for thyroid cancer  Complaints are reported by the patient now are occasional mild fatigue.    She does not complain of hair loss , cold sensitivity .  The treatments that the patient has taken include Synthroid initially and subsequently generic levothyroxine which she has taken for several years.  The response to therapy has been good.  The previous TSH was performed in 1/14, and the result was 0.58.   Compliance with the medical regimen has been as prescribed with taking the tablet in the morning before breakfast.  Thyroid Cancer:   Thyroid Cancer Diagnosed in 1998, apparently had a 5 cm papillary carcinoma on surgery. Detailed pathology report is not available.  The last visit was In 08/2009 With the endocrinologist in [place].  The presentation consistent with thyroid cancer was A Visible thyroid nodule detected by her mother, and subsequent biopsy Which was diagnostic of cancer.  Prior scanning has included Whole-body scan in 2006, negative, neck ultrasound negative in 08/2005.  The last testing was thyroglobulin level  which was 0.3 on the last visit; previously in 2006 which was 4.1. Previously Thyrogen stimulated thyroglobulin was 7.0 in 2003.   Prior treatment has included Total thyroidectomy in 1998. Had radioiodine ablation as follows: 100 mCi in 5/98, 100 mCi again in 05/2001 and 150 mCi in 03/2002.    Appointment on 01/28/2013  Component Date Value Range Status  . TSH 01/28/2013 1.21  0.35 - 5.50 uIU/mL Final  . Free T4 01/28/2013 1.02  0.60 - 1.60 ng/dL Final      Medication List       This list is accurate as of: 01/30/13  4:13 PM.  Always use your most recent med list.               levothyroxine 88 MCG  tablet  Commonly known as:  SYNTHROID, LEVOTHROID  Take 88 mcg by mouth daily before breakfast.     multivitamin with minerals Tabs  Take 1 tablet by mouth daily.        No past medical history on file.  No past surgical history on file.  No family history on file.  Social History:  reports that she has never smoked. She does not have any smokeless tobacco history on file. She reports that she does not drink alcohol or use illicit drugs.  Allergies:  Allergies  Allergen Reactions  . Asa (Aspirin)      Examination:   BP 112/70  Pulse 73  Temp(Src) 98.9 F (37.2 C)  Resp 10  Ht 5\' 1"  (1.549 m)  Wt 168 lb 4.8 oz (76.34 kg)  BMI 31.82 kg/m2  SpO2 98%  LMP 01/22/2013   GENERAL APPEARANCE: Alert And looks well.    FACE: No puffiness of face or periorbital edema.         NECK: no thyromegaly.          NEUROLOGIC EXAM: DTRs 2+ bilaterally at biceps.    Assessments   THYROID cancer: The patient had a significantly large lesion which was 5 cm but no further information is available as far as the initial diagnosis and pathology. Apparently she may have had residual thyroid cancer treated with radioactive iodine treatment 3 times. Her last whole body  scan was negative in 2006 but she had a relatively high thyroglobulin of 4.1.  Subsequently she had a thyroglobulin of 0.3 in January 2014 Clinically she has a normal exam today. We will follow her with annual thyroglobulin levels   She will also get more detailed records that she has at home  HYPOTHYROIDISM: Her TSH is now still normal with 88 mcg levothyroxine.   Continue same dosage before breakfast daily. Avoid taking any calcium or iron supplements with the thyroid supplement.    Elizah Mierzwa 01/30/2013, 4:13 PM

## 2013-02-02 NOTE — Progress Notes (Signed)
Quick Note:  Please let patient know that the lab result is normal and followup as scheduled ______

## 2013-02-03 DIAGNOSIS — C73 Malignant neoplasm of thyroid gland: Secondary | ICD-10-CM | POA: Insufficient documentation

## 2013-02-10 ENCOUNTER — Telehealth: Payer: Self-pay | Admitting: *Deleted

## 2013-02-10 NOTE — Telephone Encounter (Signed)
No answer on either phone numbers.

## 2013-02-10 NOTE — Telephone Encounter (Signed)
Message copied by Hermenia Bers on Mon Feb 10, 2013  2:16 PM ------      Message from: Reather Littler      Created: Sun Feb 02, 2013  6:08 PM       Please let patient know that the lab result is normal and followup as scheduled ------

## 2013-02-19 ENCOUNTER — Telehealth: Payer: Self-pay | Admitting: *Deleted

## 2013-02-19 NOTE — Telephone Encounter (Signed)
Left message on pt's voice-mail.

## 2013-02-19 NOTE — Telephone Encounter (Signed)
Message copied by Hermenia Bers on Wed Feb 19, 2013  3:24 PM ------      Message from: Reather Littler      Created: Sun Feb 02, 2013  6:08 PM       Please let patient know that the lab result is normal and followup as scheduled ------

## 2013-02-19 NOTE — Telephone Encounter (Signed)
Message copied by Hermenia Bers on Wed Feb 19, 2013  3:19 PM ------      Message from: Reather Littler      Created: Sun Feb 02, 2013  6:08 PM       Please let patient know that the lab result is normal and followup as scheduled ------

## 2013-02-19 NOTE — Telephone Encounter (Signed)
No answer on patient's phone. ?

## 2013-02-28 ENCOUNTER — Encounter: Payer: Self-pay | Admitting: *Deleted

## 2013-09-29 ENCOUNTER — Other Ambulatory Visit: Payer: 59

## 2013-10-01 ENCOUNTER — Encounter: Payer: Self-pay | Admitting: Endocrinology

## 2013-10-01 ENCOUNTER — Ambulatory Visit (INDEPENDENT_AMBULATORY_CARE_PROVIDER_SITE_OTHER): Payer: 59 | Admitting: Endocrinology

## 2013-10-01 VITALS — BP 112/70 | HR 76 | Temp 97.7°F | Resp 14 | Ht 61.0 in | Wt 168.2 lb

## 2013-10-01 DIAGNOSIS — E890 Postprocedural hypothyroidism: Secondary | ICD-10-CM

## 2013-10-01 DIAGNOSIS — C73 Malignant neoplasm of thyroid gland: Secondary | ICD-10-CM

## 2013-10-01 NOTE — Progress Notes (Signed)
Patient ID: Sharon Moreno, female   DOB: 1970-08-16, 43 y.o.   MRN: [id number]   Reason for Appointment:  Hypothyroidism, followup visit    History of Present Illness:   Hypothyroidism:   The hypothyroidism was first diagnosed In 1998 after surgery for thyroid cancer  Complaints are reported by the patient now are no unusual fatigue given her work and home schedules.    She does not complain of hair loss, has had chronic cold sensitivity .  The treatments that the patient has taken include Synthroid initially and subsequently generic levothyroxine which she has taken for several years.   Her Synthroid dose has been relatively stable  Compliance with the medical regimen has been as prescribed with taking the tablet in the morning before breakfast.  Thyroid Cancer:   Thyroid Cancer Diagnosed in 1998, apparently had a 5 cm papillary carcinoma on surgery. Detailed pathology report is not available.  The last visit was In 08/2009 With the endocrinologist in [place].  The presentation consistent with thyroid cancer was A Visible thyroid nodule detected by her mother, and subsequent biopsy Which was diagnostic of cancer.  Prior scanning has included Whole-body scan in 2006, negative, neck ultrasound negative in 08/2005.  The last testing was thyroglobulin level  which was 0.3 on the last visit; previously in 2006 which was 4.1. Previously Thyrogen stimulated thyroglobulin was 7.0 in 2003.   Prior treatment has included Total thyroidectomy in 1998.  Had radioactive iodine ablation as follows: 100 mCi in 5/98, 100 mCi again in 05/2001 and 150 mCi in 03/2002.    No visits with results within 1 Week(s) from this visit. Latest known visit with results is:  Appointment on 01/28/2013  Component Date Value Ref Range Status  . TSH 01/28/2013 1.21  0.35 - 5.50 uIU/mL Final  . Free T4 01/28/2013 1.02  0.60 - 1.60 ng/dL Final      Medication List       This list is accurate as of: 10/01/13   4:46 PM.  Always use your most recent med list.               levothyroxine 88 MCG tablet  Commonly known as:  SYNTHROID, LEVOTHROID  Take 88 mcg by mouth daily before breakfast.     multivitamin with minerals Tabs tablet  Take 1 tablet by mouth daily.        No past medical history on file.  No past surgical history on file.  No family history on file.  Social History:  reports that she has never smoked. She does not have any smokeless tobacco history on file. She reports that she does not drink alcohol or use illicit drugs.  Allergies:  Allergies  Allergen Reactions  . Asa [Aspirin]      Examination:   BP 112/70  Pulse 76  Temp(Src) 97.7 F (36.5 C)  Resp 14  Ht 5\' 1"  (1.549 m)  Wt 168 lb 3.2 oz (76.295 kg)  BMI 31.80 kg/m2  SpO2 98%  LMP 09/30/2013   GENERAL APPEARANCE: She looks well, no puffiness of face or hands     NECK: No thyroid enlargement or nodule, no lymphadenopathy        NEUROLOGIC EXAM: Biceps reflexes are showing normal relaxation No pedal edema    Assessments   THYROID cancer: The diagnosis was made about 17 years ago. The patient had a significantly large lesion which was 5 cm but no further information is available as far as the initial  diagnosis and pathology. She was treated with radioactive iodine treatment 3 times subsequently. Her last whole body scan was negative in 2006  Most recently she had a thyroglobulin of 0.3 in January 2014 Clinically she has a normal neck exam today. We will recheck her thyroid level and thyroglobulin now   HYPOTHYROIDISM: She is on 88 mcg levothyroxine.   Garnell Begeman 10/01/2013, 4:46 PM   Addendum: Thyroid levels normal, thyroglobulin pending. To stay on the same medication  Office Visit on 10/01/2013  Component Date Value Ref Range Status  . Free T4 10/01/2013 1.01  0.60 - 1.60 ng/dL Final  . TSH 10/01/2013 1.99  0.35 - 5.50 uIU/mL Final

## 2013-10-02 LAB — TSH: TSH: 1.99 u[IU]/mL (ref 0.35–5.50)

## 2013-10-02 LAB — T4, FREE: FREE T4: 1.01 ng/dL (ref 0.60–1.60)

## 2013-10-03 LAB — THYROGLOBULIN LEVEL: THYROGLOBULIN: 0.3 ng/mL (ref 0.0–55.0)

## 2013-10-04 NOTE — Progress Notes (Signed)
Quick Note:  Please let patient know that thyroid cancer test is good and no further action needed ______

## 2014-01-09 ENCOUNTER — Other Ambulatory Visit: Payer: Self-pay

## 2014-01-09 DIAGNOSIS — Z1231 Encounter for screening mammogram for malignant neoplasm of breast: Secondary | ICD-10-CM

## 2014-01-13 ENCOUNTER — Ambulatory Visit
Admission: RE | Admit: 2014-01-13 | Discharge: 2014-01-13 | Disposition: A | Discharge status: Home / Self Care | Payer: 59 | Source: Ambulatory Visit

## 2014-01-13 ENCOUNTER — Encounter (INDEPENDENT_AMBULATORY_CARE_PROVIDER_SITE_OTHER): Payer: Self-pay

## 2014-01-13 DIAGNOSIS — Z1231 Encounter for screening mammogram for malignant neoplasm of breast: Secondary | ICD-10-CM

## 2015-07-08 ENCOUNTER — Other Ambulatory Visit: Payer: Self-pay | Admitting: Family Medicine

## 2015-07-08 ENCOUNTER — Other Ambulatory Visit: Payer: Self-pay | Admitting: Lactation Services

## 2015-07-08 DIAGNOSIS — D497 Neoplasm of unspecified behavior of endocrine glands and other parts of nervous system: Secondary | ICD-10-CM

## 2015-07-14 ENCOUNTER — Ambulatory Visit
Admission: RE | Admit: 2015-07-14 | Discharge: 2015-07-14 | Disposition: A | Discharge status: Home / Self Care | Payer: 59 | Source: Ambulatory Visit | Attending: Family Medicine | Admitting: Family Medicine

## 2015-07-14 DIAGNOSIS — D497 Neoplasm of unspecified behavior of endocrine glands and other parts of nervous system: Secondary | ICD-10-CM

## 2016-08-08 ENCOUNTER — Other Ambulatory Visit: Payer: Self-pay | Admitting: Internal Medicine

## 2016-08-08 DIAGNOSIS — C73 Malignant neoplasm of thyroid gland: Secondary | ICD-10-CM | POA: Diagnosis not present

## 2016-08-08 DIAGNOSIS — D497 Neoplasm of unspecified behavior of endocrine glands and other parts of nervous system: Secondary | ICD-10-CM

## 2016-08-09 DIAGNOSIS — R070 Pain in throat: Secondary | ICD-10-CM | POA: Diagnosis not present

## 2016-08-09 DIAGNOSIS — K219 Gastro-esophageal reflux disease without esophagitis: Secondary | ICD-10-CM | POA: Diagnosis not present

## 2016-08-14 ENCOUNTER — Ambulatory Visit
Admission: RE | Admit: 2016-08-14 | Discharge: 2016-08-14 | Disposition: A | Discharge status: Home / Self Care | Payer: 59 | Source: Ambulatory Visit | Attending: Internal Medicine | Admitting: Internal Medicine

## 2016-08-14 ENCOUNTER — Other Ambulatory Visit: Payer: 59

## 2016-08-14 DIAGNOSIS — D497 Neoplasm of unspecified behavior of endocrine glands and other parts of nervous system: Secondary | ICD-10-CM

## 2016-08-14 DIAGNOSIS — C73 Malignant neoplasm of thyroid gland: Secondary | ICD-10-CM | POA: Diagnosis not present

## 2016-09-28 DIAGNOSIS — R21 Rash and other nonspecific skin eruption: Secondary | ICD-10-CM | POA: Diagnosis not present

## 2016-11-08 DIAGNOSIS — E039 Hypothyroidism, unspecified: Secondary | ICD-10-CM | POA: Diagnosis not present

## 2016-11-08 DIAGNOSIS — C73 Malignant neoplasm of thyroid gland: Secondary | ICD-10-CM | POA: Diagnosis not present

## 2016-11-10 ENCOUNTER — Other Ambulatory Visit: Payer: Self-pay | Admitting: Internal Medicine

## 2016-11-10 DIAGNOSIS — E039 Hypothyroidism, unspecified: Secondary | ICD-10-CM

## 2016-11-10 DIAGNOSIS — C73 Malignant neoplasm of thyroid gland: Secondary | ICD-10-CM

## 2016-11-24 ENCOUNTER — Other Ambulatory Visit: Payer: 59

## 2017-01-25 DIAGNOSIS — Z01419 Encounter for gynecological examination (general) (routine) without abnormal findings: Secondary | ICD-10-CM | POA: Diagnosis not present

## 2017-01-25 DIAGNOSIS — R8290 Unspecified abnormal findings in urine: Secondary | ICD-10-CM | POA: Diagnosis not present

## 2017-03-16 ENCOUNTER — Ambulatory Visit
Admission: RE | Admit: 2017-03-16 | Discharge: 2017-03-16 | Disposition: A | Discharge status: Home / Self Care | Payer: 59 | Source: Ambulatory Visit | Attending: Family Medicine | Admitting: Family Medicine

## 2017-03-16 ENCOUNTER — Other Ambulatory Visit: Payer: Self-pay | Admitting: Family Medicine

## 2017-03-16 DIAGNOSIS — M7989 Other specified soft tissue disorders: Secondary | ICD-10-CM | POA: Diagnosis not present

## 2017-03-16 DIAGNOSIS — M79661 Pain in right lower leg: Secondary | ICD-10-CM

## 2017-03-16 DIAGNOSIS — Z23 Encounter for immunization: Secondary | ICD-10-CM | POA: Diagnosis not present

## 2017-06-27 DIAGNOSIS — Z85850 Personal history of malignant neoplasm of thyroid: Secondary | ICD-10-CM | POA: Diagnosis not present

## 2017-06-27 DIAGNOSIS — Z760 Encounter for issue of repeat prescription: Secondary | ICD-10-CM | POA: Diagnosis not present

## 2017-06-27 DIAGNOSIS — E890 Postprocedural hypothyroidism: Secondary | ICD-10-CM | POA: Diagnosis not present

## 2017-08-01 ENCOUNTER — Other Ambulatory Visit: Payer: Self-pay | Admitting: Family Medicine

## 2017-08-01 ENCOUNTER — Ambulatory Visit
Admission: RE | Admit: 2017-08-01 | Discharge: 2017-08-01 | Disposition: A | Discharge status: Home / Self Care | Payer: 59 | Source: Ambulatory Visit | Attending: Family Medicine | Admitting: Family Medicine

## 2017-08-01 DIAGNOSIS — R042 Hemoptysis: Secondary | ICD-10-CM | POA: Diagnosis not present

## 2017-08-01 DIAGNOSIS — R399 Unspecified symptoms and signs involving the genitourinary system: Secondary | ICD-10-CM | POA: Diagnosis not present

## 2017-08-01 DIAGNOSIS — E890 Postprocedural hypothyroidism: Secondary | ICD-10-CM | POA: Diagnosis not present

## 2017-09-18 DIAGNOSIS — E039 Hypothyroidism, unspecified: Secondary | ICD-10-CM | POA: Diagnosis not present

## 2017-10-31 DIAGNOSIS — E039 Hypothyroidism, unspecified: Secondary | ICD-10-CM | POA: Diagnosis not present

## 2017-11-18 DIAGNOSIS — R05 Cough: Secondary | ICD-10-CM | POA: Diagnosis not present

## 2018-01-30 DIAGNOSIS — Z6833 Body mass index (BMI) 33.0-33.9, adult: Secondary | ICD-10-CM | POA: Diagnosis not present

## 2018-01-30 DIAGNOSIS — Z124 Encounter for screening for malignant neoplasm of cervix: Secondary | ICD-10-CM | POA: Diagnosis not present

## 2018-01-30 DIAGNOSIS — Z01419 Encounter for gynecological examination (general) (routine) without abnormal findings: Secondary | ICD-10-CM | POA: Diagnosis not present

## 2018-06-23 IMAGING — US US EXTREM LOW VENOUS*R*
1 series · 13 of 24 positions shown · non-contrast
Comparison: None.

CLINICAL DATA: Right calf and ankle pain with swelling



[Series 1: us extrem low venous*right* · 0.07mm/px · 13 of 37 slices shown]
[im 1/37]
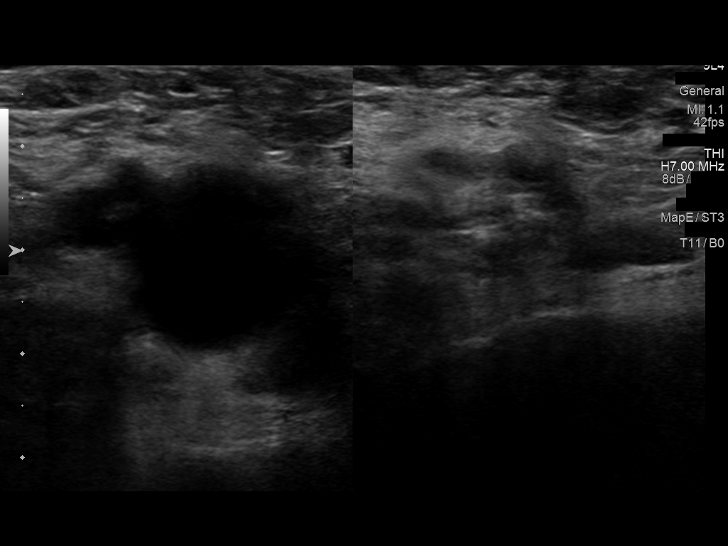
[im 4/37]
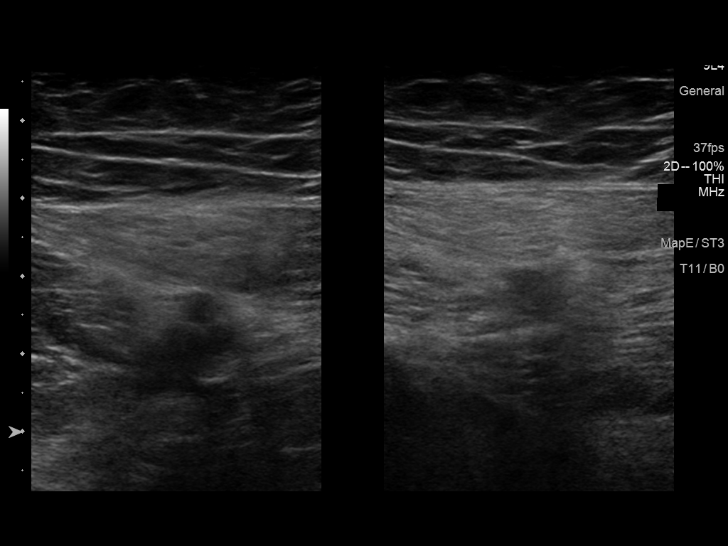
[im 7/37]
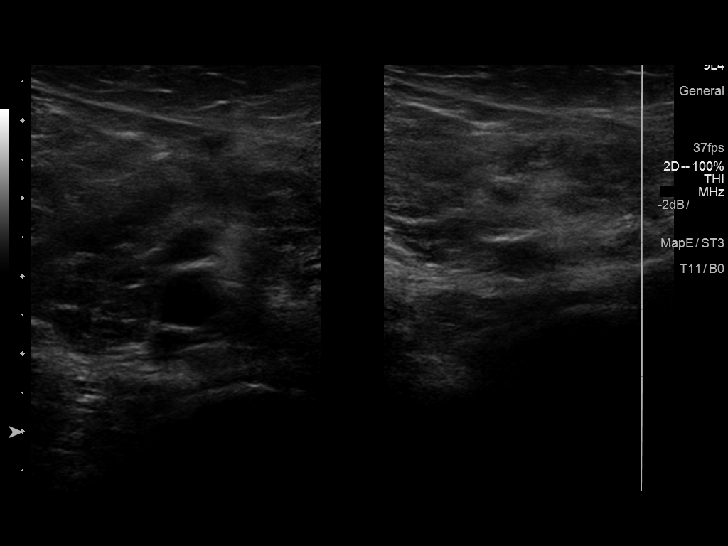
[im 10/37]
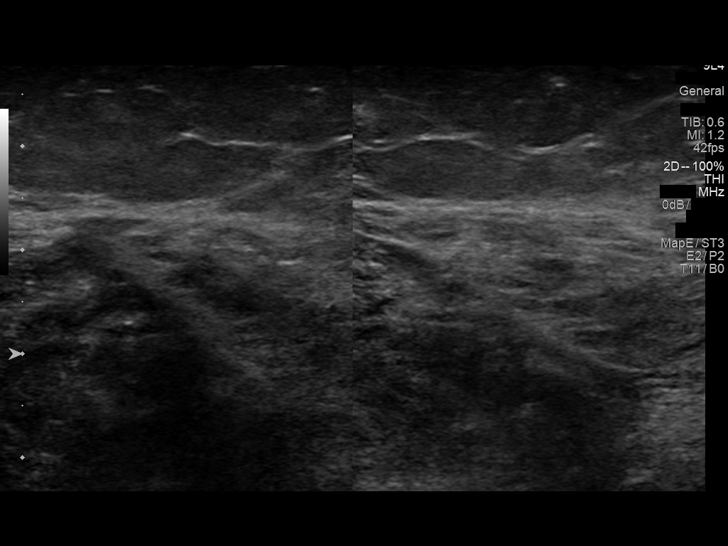
[im 13/37]
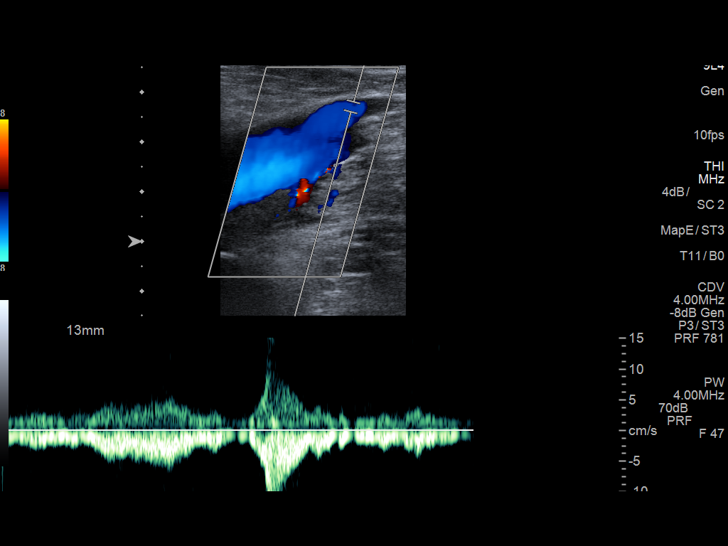
[im 16/37]
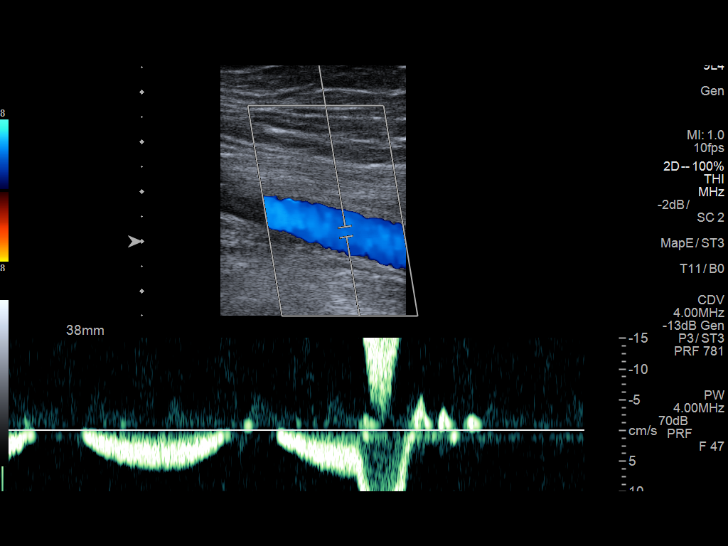
[im 19/37]
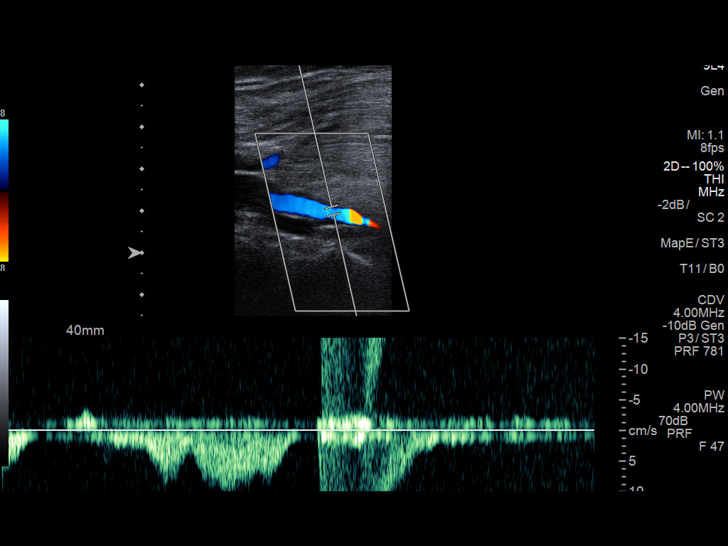
[im 21/37]
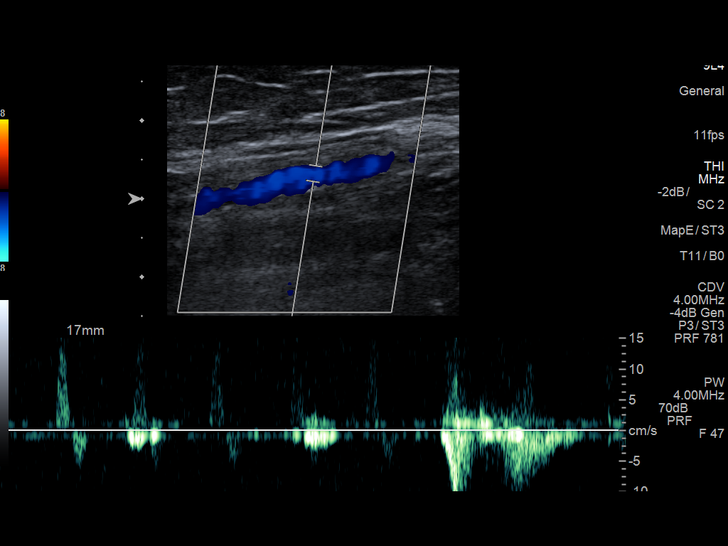
[im 24/37]
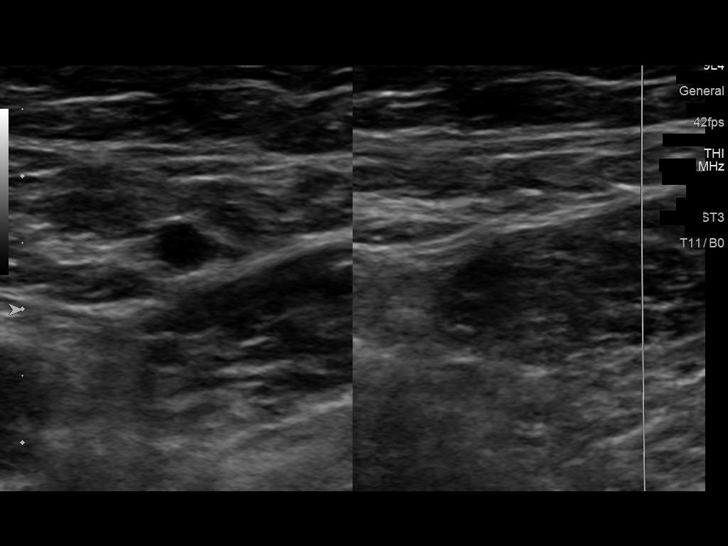
[im 27/37]
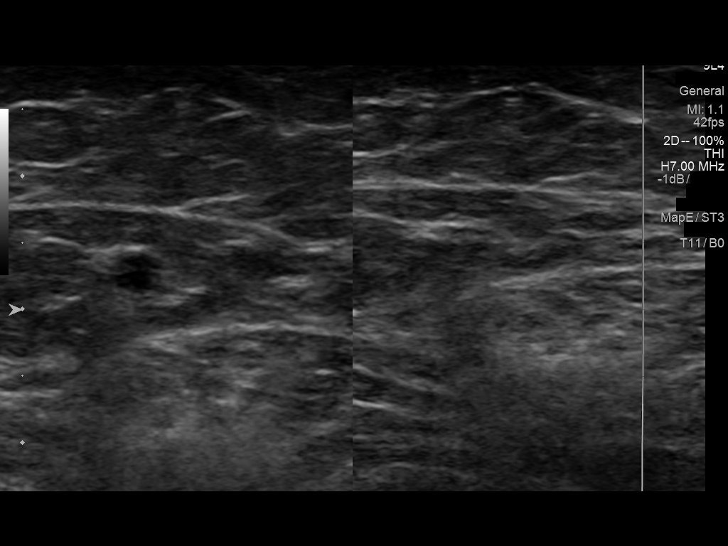
[im 30/37]
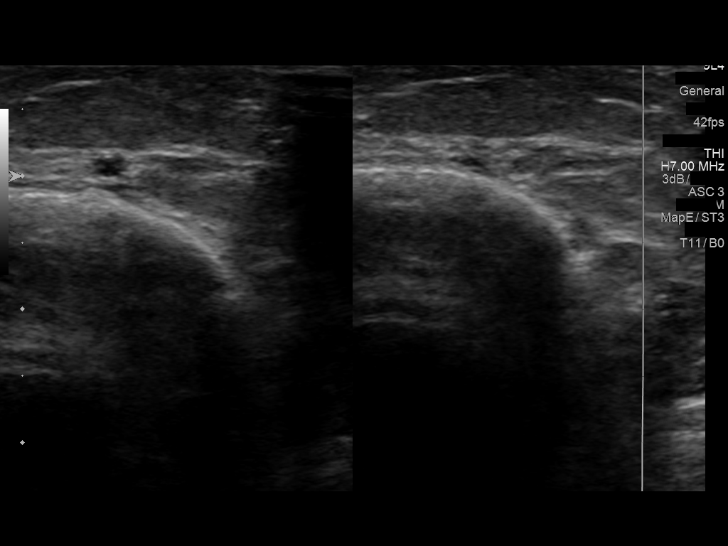
[im 33/37]
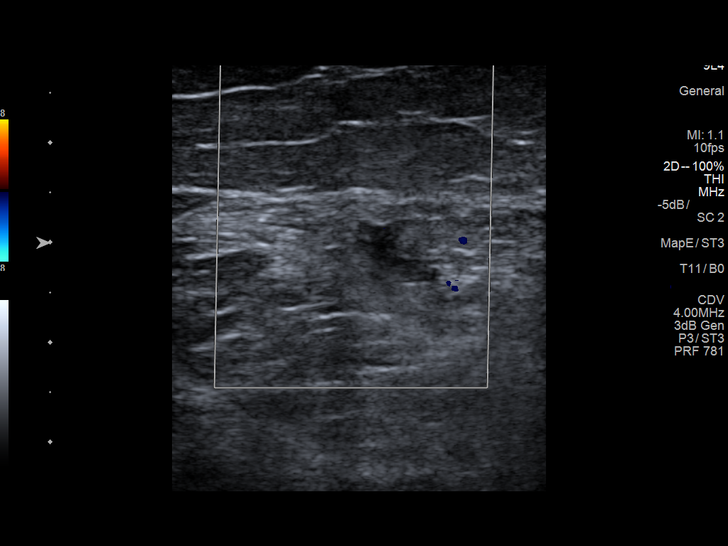
[im 37/37]
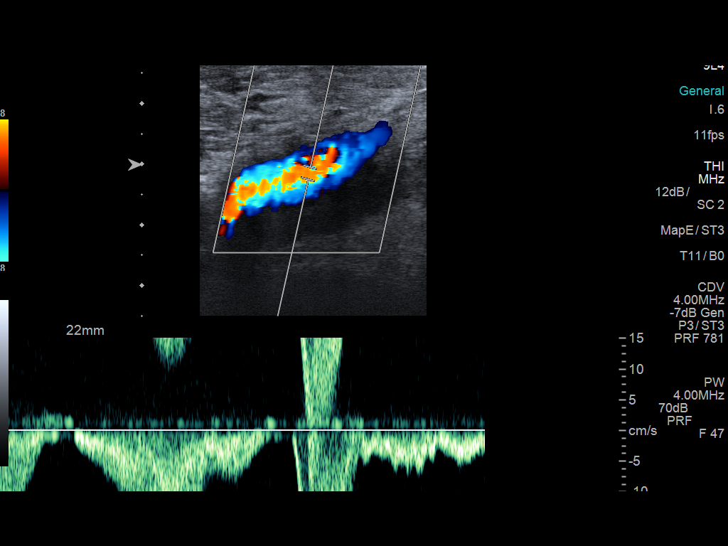

[13 of 24 positions shown; findings below may reference images not displayed]

FINDINGS: Contralateral Common Femoral Vein: Respiratory phasicity is normal
and symmetric with the symptomatic side. No evidence of thrombus.
Normal compressibility.

Common Femoral Vein: No evidence of thrombus. Normal
compressibility, respiratory phasicity and response to augmentation.

Saphenofemoral Junction: No evidence of thrombus. Normal
compressibility and flow on color Doppler imaging.

Profunda Femoral Vein: No evidence of thrombus. Normal
compressibility and flow on color Doppler imaging.

Femoral Vein: No evidence of thrombus. Normal compressibility,
respiratory phasicity and response to augmentation.

Popliteal Vein: No evidence of thrombus. Normal compressibility,
respiratory phasicity and response to augmentation.

Calf Veins: No evidence of thrombus. Normal compressibility and flow
on color Doppler imaging.

Superficial Great Saphenous Vein: No evidence of thrombus. Normal
compressibility and flow on color Doppler imaging.

Venous Reflux:  None.

Other Findings: Nonspecific right calf soft tissue fluid/edema
evident.
IMPRESSION: No evidence of DVT within the right lower extremity.

## 2018-08-07 DIAGNOSIS — R6884 Jaw pain: Secondary | ICD-10-CM | POA: Diagnosis not present

## 2018-08-07 DIAGNOSIS — Z85850 Personal history of malignant neoplasm of thyroid: Secondary | ICD-10-CM | POA: Diagnosis not present

## 2018-08-07 DIAGNOSIS — E890 Postprocedural hypothyroidism: Secondary | ICD-10-CM | POA: Diagnosis not present

## 2018-11-05 DIAGNOSIS — E890 Postprocedural hypothyroidism: Secondary | ICD-10-CM | POA: Diagnosis not present

## 2019-04-21 ENCOUNTER — Other Ambulatory Visit: Payer: Self-pay

## 2019-04-22 ENCOUNTER — Ambulatory Visit: Payer: 59 | Admitting: Internal Medicine

## 2019-04-22 ENCOUNTER — Encounter: Payer: Self-pay | Admitting: Internal Medicine

## 2019-04-22 VITALS — BP 120/70 | HR 86 | Ht 61.0 in | Wt 179.0 lb

## 2019-04-22 DIAGNOSIS — K118 Other diseases of salivary glands: Secondary | ICD-10-CM

## 2019-04-22 DIAGNOSIS — E890 Postprocedural hypothyroidism: Secondary | ICD-10-CM | POA: Diagnosis not present

## 2019-04-22 DIAGNOSIS — C73 Malignant neoplasm of thyroid gland: Secondary | ICD-10-CM

## 2019-04-22 LAB — TSH: TSH: 3.04 u[IU]/mL (ref 0.35–4.50)

## 2019-04-22 LAB — T4, FREE: Free T4: 0.95 ng/dL (ref 0.60–1.60)

## 2019-04-22 NOTE — Patient Instructions (Signed)
Please stop at the lab.  Please continue Levothyroxine 100 mcg daily.  Take the thyroid hormone every day, with water, at least 30 minutes before breakfast, separated by at least 4 hours from: - acid reflux medications - calcium - iron - multivitamins  Please come back for a follow-up appointment in 6 months. 

## 2019-04-22 NOTE — Progress Notes (Addendum)
Patient ID: Sharon Moreno, female   DOB: [birth year], 48 y.o.   MRN: [id number]    HPI  Sharon Moreno is a 48 y.o.-year-old female, referred by her PCP, Dr. Lujean Amel, for management of papillary thyroid cancer and postsurgical hypothyroidism.  Patient was previously seen by Dr. Gilberto Better, in [phone], Dr. Dwyane Dee, last visit 5 years ago.  She also saw Dr. Awilda Metro from 2016-2018.  Pt. has been dx with papillary thyroid carcinoma in 1986.  She had 3 RAI treatments.  She also had posttreatment whole-body scans-latest R TSH stimulated WBS was checked in 2017 and this was negative for metastases.  Reviewed her cancer history: 1998: Thyroid cancer diagnosed by biopsy, after a thyroid nodule was observed by her mother.   10/1996: Total thyroidectomy: PTC, No further information is available about the pathology 10/1996: RAI tx with 100 mCi 2000: WBS: Negative 05/2001: RAI tx with 100 mCi 03/2002: RAI tx with 150 mCi 05/15/2003: Neck ultrasound: Ultrasound examination demonstrates two nodules in the left side of the neck deep to the anterior musculature where the left lobe of the thyroid gland previously resided. This is just medial to the jugular vein. One of these nodules is 5.5 mm in diameter and the other is just inferior and slightly deeper and measures 5.0 mm. I suspect these represent recurrence of tumor.   No other abnormality. No evidence of nodules seen to the right of the trachea.   IMPRESSION Two nodules to the left of the trachea at the site of the previous left lobe of the thyroid gland. This is worrisome for tumor recurrence. 2007: WBS: Negative 08/18/2015: rTSH stimulated WBS: Negative  08/14/2016: Neck ultrasound: Parenchymal Echotexture: Surgically absent Isthmus: Surgically absent Right lobe: Surgically absent Left lobe: Surgically absent There is a 0.5 x0.3 x 0.4 cm soft tissue nodule in the thyroidectomy bed just to the left of midline (previously 7 x 3 x 6 mm).  IMPRESSION: 1. No interval enlargement of small nonspecific nodule presumably residual/recurrent in the left thyroidectomy bed.  Thyroglobulin was detectable, while ATA antibodies were negative: Lab Results  Component Value Date   THYROGLB 0.3 10/01/2013   No results found for: THGAB   08/19/2014: Tg 0.5, ATA <0.1 05/05/2015: ATA <1.0  Pt denies: - feeling nodules in neck - dysphagia - choking - SOB with lying down But feels neck appears larger. She does describe hoarseness.  For her postsurgical hypothyroidism, she is on Levothyroxine 100 mcg daily (changed ~6 mo ago), taken: - fasting - not wih water - skips b'fast - no calcium, iron, PPIs - + occasional multivitamins in the evening  I reviewed pt's thyroid tests: 01/10/2019: TSH 0.119 11/05/2018: TSH 0.03 08/07/2018: TSH 6.01   Lab Results  Component Value Date   TSH 1.99 10/01/2013   TSH 1.21 01/28/2013   FREET4 1.01 10/01/2013   FREET4 1.02 01/28/2013    Pt describes: - no fatigue - + weight gain 6-8 lbs in last 6-8 m - + Hot flashes - no constipation, + diarrhea - no dry skin - no hair loss - no anxiety/depression  She has no FH of thyroid disordersNo FH of thyroid cancer.  No h/o radiation tx to head or neck other than RAI treatments.  She is on vitamin D daily.  LMP 3 years ago.  ROS: Constitutional: + Please see HPI, + nocturia Eyes: + Blurry vision, no xerophthalmia ENT: no sore throat, + see HPI, + decreased hearing Cardiovascular: no CP/SOB/palpitations/leg swelling Respiratory: no cough/SOB Gastrointestinal: no  N/V/D/C Musculoskeletal: no muscle/joint aches Skin: no rashes Neurological: no tremors/numbness/tingling/dizziness Psychiatric: no depression/anxiety  History reviewed. No pertinent past medical history.  Surgeries: -Please see HPI  Social History   Socioeconomic History  . Marital status: Married    Spouse name: Not on file  . Number of children: 2: 43 and 29 years  old in 04/2019  . Years of education: Not on file  . Highest education level: Not on file  Occupational History  .  Caseworker for social services  Social Needs  . Financial resource strain: Not on file  . Food insecurity    Worry: Not on file    Inability: Not on file  . Transportation needs    Medical: Not on file    Non-medical: Not on file  Tobacco Use  . Smoking status: Never Smoker  . Smokeless tobacco: Never Used  Substance and Sexual Activity  . Alcohol use: No  . Drug use: No   Current Outpatient Medications on File Prior to Visit  Medication Sig Dispense Refill  . levothyroxine (SYNTHROID, LEVOTHROID) 88 MCG tablet Take 112 mcg by mouth daily before breakfast.     . Multiple Vitamin (MULTIVITAMIN WITH MINERALS) TABS Take 1 tablet by mouth daily.     No current facility-administered medications on file prior to visit.    Allergies  Allergen Reactions  . Asa [Aspirin]    Family history: -Diabetes in aunt and maternal grandfather  PE: BP 120/70   Pulse 86   Ht 5' 1" (1.549 m)   Wt 179 lb (81.2 kg)   SpO2 98%   BMI 33.82 kg/m  Wt Readings from Last 3 Encounters:  04/22/19 179 lb (81.2 kg)  10/01/13 168 lb 3.2 oz (76.3 kg)  01/30/13 168 lb 4.8 oz (76.3 kg)   Constitutional: overweight, in NAD Eyes: PERRLA, EOMI, no exophthalmos ENT: moist mucous membranes, no neck masses palpated, thyroidectomy scar healed, no cervical lymphadenopathy Cardiovascular: RRR, No MRG Respiratory: CTA B Gastrointestinal: abdomen soft, NT, ND, BS+ Musculoskeletal: no deformities, strength intact in all 4 Skin: moist, warm, no rashes Neurological: no tremor with outstretched hands, DTR normal in all 4  ASSESSMENT: 1. Thyroid cancer - see HPI  2. Postsurgical Hypothyroidism  PLAN:  1. Thyroid cancer - papillary - I had a long discussion with the patient about her history of thyroid cancer, now in remission - I reassured her that papillary thyroid cancer is a slow growing  cancer with good prognosis; her life expectancy or quality of life is unlikely to be reduced due to the cancer.  -Unfortunately I do not have details about the tumor size or pathology, but she did have 3 RAI treatments with persistently detectable thyroglobulin. I explained that the main role of this is to facilitate monitoring in the long run (by checking thyroglobulin).  If the thyroglobulin is detectable, there are most likely persistent cancer cells, but they have not been localized by previous imaging tests.  - At this visit, we will check a thyroglobulin and ATA antibodies.  If the thyroglobulin is detectable and especially trending up, she may need further imaging tests, most likely started with a neck ultrasound, although I do not feel masses on palpation of her neck.   - I will then see the patient in approximately 6 months  2.  Patient with h/o total thyroidectomy for cancer, now with iatrogenic hypothyroidism, on levothyroxine 100 mcg daily. - Reviewed most recent TFTs from 01/2019 and the TSH was suppressed. - she appears  euthyroid, without any thyrotoxic signs or symptoms - We discussed about correct intake of levothyroxine, fasting, with water, separated by at least 30 minutes from breakfast, and separated by more than 4 hours from calcium, iron, multivitamins, acid reflux medications (PPIs).  Pt. is taking it correctly, but I advised her to take it with water to avoid lodging in the esophagus. - will check thyroid tests today: TSH, free T4 - target TSH: In the lower part of the normal interval or even slightly under the LLL.  We discussed about the effect of significant TSH suppression on heart and bone. - If these are abnormal, she will need to return in 5-6 weeks for repeat labs - If these are normal, we will recheck them at next visit  Component     Latest Ref Rng & Units 04/22/2019  Thyroglobulin     ng/mL 0.3 (L)  TSH     0.35 - 4.50 uIU/mL 3.04  T4,Free(Direct)     0.60 -  1.60 ng/dL 0.95  Thyroglobulin Ab     < or = 1 IU/mL <1   Thyroglobulin is still detectable, but lower than before.  I would suggest to check another neck ultrasound now.  As of now, the TSH is still high in the normal range. Will increase the levothyroxine to 112 mcg daily and recheck her test in 1.5 months.   04/25/2019: Neck ultrasound: Narrative & Impression    CLINICAL DATA:  Other. 48 year old female with a history of thyroidectomy and radioactive iodine therapy in 1996. She has known nodularity within the resection bed inferior to the prior thyroid isthmus.  EXAM: THYROID ULTRASOUND  TECHNIQUE: Ultrasound examination of the thyroid gland and adjacent soft tissues was performed.  COMPARISON:  Prior thyroid ultrasound 08/14/2016 and 07/14/2015  FINDINGS: The thyroid gland is surgically absent. Sonographic interrogation of the right and left resection beds demonstrates no evidence of residual or recurrent thyroid tissue or nodularity. There is no evidence of lymphadenopathy. However, there is a circumscribed anechoic structure with posterior acoustic enhancement in the superior aspect of the left neck measuring 0.6 by 0.7 by 1.2 cm. This has not been previously identified.  In the midline, inferior to the prior thyroid isthmus there is a small hypoechoic solid nodule measuring 0.4 x 0.3 x 0.4 cm. This has slightly decreased in size compared to January of 2017 when the abnormality measured 0.7 x 0.3 x 0.6 cm.  IMPRESSION: 1. Slowly decreasing size of small nodule inferior to the prior thyroid isthmus compared to prior studies dating back to January of 2017. Involution over time suggests a benign process. 2. Nonspecific simple appearing cyst in the superior aspect of the right neck measures 1.2 x 0.6 x 0.7 cm and has not been previously identified. This is of uncertain etiology and significance. While this is likely benign, any new cystic lesion in the neck should  be evaluated further to exclude the possibility of necrotic lymphadenopathy. Recommend contrast-enhanced CT scan of the neck. 3. Surgical changes of total thyroidectomy.   Electronically Signed   By: Jacqulynn Cadet M.D.   On: 04/27/2019 09:23   Will go ahead with the neck CT after checking with the patient.  05/22/2019: CT neck: CT NECK WITH CONTRAST  TECHNIQUE: Multidetector CT imaging of the neck was performed using the standard protocol following the bolus administration of intravenous contrast.  CONTRAST:  4m ISOVUE-300 IOPAMIDOL (ISOVUE-300) INJECTION 61%  COMPARISON:  Thyroid ultrasound 04/25/2019.  FINDINGS: Pharynx and larynx: Laryngeal and pharyngeal soft  tissue contours are within normal limits. Negative parapharyngeal and retropharyngeal spaces.  Salivary glands: Negative sublingual space. Submandibular glands are within normal limits.  The right parotid gland is within normal limits; there is a tiny 3-4 millimeters soft tissue nodule in the anterior right parotid space on series 3, image 22 which most resembles a physiologic lymph node.  In the left anterior superior left parotid space there is a a 12 x 15 millimeter enhancing and slightly irregular round soft tissue nodule. See series 3, image 21 and coronal image 45. The remainder of the left parotid gland appears normal. Grossly normal left stylomastoid foramina.  Thyroid: Absent, no parotid parenchyma identified.  Lymph nodes: The largest lymph nodes identified in the neck is at the right level IIa station measuring 8-9 millimeters short axis. It is unclear whether or not 1 of these might correspond to the recent ultrasound finding. See series 3, images 40 and 43 and coronal image 53. Contralateral left level IIa nodes are 5-6 millimeters short axis. Diminutive bilateral cervical lymph nodes elsewhere. No cystic or necrotic nodes are identified. No calcified nodes.  Vascular:  Suboptimal intravascular contrast bolus but the major vascular structures in the neck and at the skull base appear to be patent.  Limited intracranial: Negative.  Visualized orbits: Negative.  Mastoids and visualized paranasal sinuses: Clear.  Skeleton: No acute or suspicious osseous lesion identified.  Upper chest: Negative upper lungs. No superior mediastinal lymphadenopathy. Ectatic appearing distal aortic arch, about 30 millimeters diameter on series 3, image 107. Negative visible axillary lymph nodes.  IMPRESSION: 1. 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 Recommend ENT referral. 2. No cystic mass or lymphadenopathy in the neck to correspond to the recent ultrasound. The largest lymph nodes are at the right level 2 station measuring 8-9 mm short axis, and remain normal by CT criteria. 3. Negative thyroid bed. 4. Ectatic distal aortic arch, about 30 mm diameter. Recommend annual imaging followup by Chest CTA or MRA. This recommendation follows 2010 ACCF/AHA/AATS/ACR/ASA/SCA/SCAI/SIR/STS/SVM Guidelines for the Diagnosis and Management of Patients with Thoracic Aortic Disease. Circulation.2010; 121: D741-O878. Aortic aneurysm NOS (ICD10-I71.9)   Electronically Signed   By: Genevie Ann M.D.   On: 05/21/2019 23:53  We will refer to ENT.  We will also forward the results to PCP regarding the aortic arch dilatation.  Philemon Kingdom, MD PhD [facility] Endocrinology

## 2019-04-23 ENCOUNTER — Telehealth: Payer: Self-pay

## 2019-04-23 LAB — THYROGLOBULIN LEVEL: Thyroglobulin: 0.3 ng/mL — ABNORMAL LOW

## 2019-04-23 LAB — THYROGLOBULIN ANTIBODY: Thyroglobulin Ab: <1 IU/mL (ref <=1)

## 2019-04-23 MED ORDER — LEVOTHYROXINE SODIUM 112 MCG PO TABS: 112.0000 ug | ORAL_TABLET | Freq: Every day | ORAL | 3 refills | Status: DC

## 2019-04-23 NOTE — Telephone Encounter (Signed)
-----   Message from Philemon Kingdom, MD sent at 04/23/2019 12:13 PM EDT ----- Lenna Sciara, can you please call pt: Sharon Moreno TSH is slightly too high (but still normal) so I would suggest to increase the dose of levothyroxine to 112 mcg daily.  I sent this to [facility] pharmacy.  She will need to come for labs in 1.5 months.  Labs are in. Sharon Moreno thyroglobulin still detectable, but lower than in 2016, when he was 0.5.  For now, I would suggest to get another neck ultrasound.  I ordered this to be done downstairs.

## 2019-04-25 ENCOUNTER — Ambulatory Visit
Admission: RE | Admit: 2019-04-25 | Discharge: 2019-04-25 | Disposition: A | Discharge status: Home / Self Care | Payer: 59 | Source: Ambulatory Visit | Attending: Internal Medicine | Admitting: Internal Medicine

## 2019-04-25 DIAGNOSIS — C73 Malignant neoplasm of thyroid gland: Secondary | ICD-10-CM

## 2019-04-25 MED ORDER — LEVOTHYROXINE SODIUM 112 MCG PO TABS: 112.0000 ug | ORAL_TABLET | Freq: Every day | ORAL | 3 refills | Status: DC

## 2019-04-25 NOTE — Telephone Encounter (Signed)
Notified patient of message from Dr. Cruzita Lederer, patient expressed understanding and agreement. No further questions.  Patient did want her pharmacy changed.

## 2019-04-25 NOTE — Telephone Encounter (Signed)
Left message for patient to return our call at [phone].  

## 2019-04-29 ENCOUNTER — Telehealth: Payer: Self-pay

## 2019-04-29 NOTE — Telephone Encounter (Signed)
-----   Message from Philemon Kingdom, MD sent at 04/29/2019  1:36 PM EDT ----- Lenna Sciara, can you please call pt: Patient's neck ultrasound shows an absent thyroid, as expected.  There is a small mass in the thyroid fossa which has decreased compared to 2017, most likely benign and not worrisome..  In the left upper neck there is a cyst measuring 1.2 cm in the largest dimension and we will need to make sure that this is not a lymph node.  The radiologist suggested (and I agree) to check a CT of the neck with contrast to get a better view of this region.  Would she agreed to have this done?  If so, I will order it.

## 2019-04-30 ENCOUNTER — Other Ambulatory Visit: Payer: Self-pay | Admitting: Internal Medicine

## 2019-04-30 DIAGNOSIS — R221 Localized swelling, mass and lump, neck: Secondary | ICD-10-CM

## 2019-04-30 NOTE — Telephone Encounter (Signed)
OK, I ordered it for downstairs

## 2019-04-30 NOTE — Telephone Encounter (Signed)
Notified patient of message from Dr. Cruzita Lederer, patient expressed understanding and agreement. No further questions.  Patient would like to proceed with the CT, I advised her this may be imaging that needs a prior approval from her insurance so it could take up to 1 week to get processed and scheduled.  Once ordered patient would like to know location and number of facility to assist with getting it scheduled.

## 2019-05-12 ENCOUNTER — Encounter: Payer: 59 | Admitting: Obstetrics and Gynecology

## 2019-05-21 ENCOUNTER — Ambulatory Visit
Admission: RE | Admit: 2019-05-21 | Discharge: 2019-05-21 | Disposition: A | Discharge status: Home / Self Care | Payer: 59 | Source: Ambulatory Visit | Attending: Internal Medicine | Admitting: Internal Medicine

## 2019-05-21 ENCOUNTER — Other Ambulatory Visit: Payer: Self-pay

## 2019-05-21 DIAGNOSIS — R221 Localized swelling, mass and lump, neck: Secondary | ICD-10-CM

## 2019-05-21 MED ORDER — IOPAMIDOL (ISOVUE-300) INJECTION 61%
75.0000 mL | Freq: Once | INTRAVENOUS | Status: AC | PRN
  Administered 2019-05-21: 75 mL via INTRAVENOUS

## 2019-05-23 ENCOUNTER — Telehealth: Payer: Self-pay

## 2019-05-23 NOTE — Telephone Encounter (Signed)
-----   Message from Philemon Kingdom, MD sent at 05/23/2019  2:54 PM EST ----- Lenna Sciara, can you please call pt: The neck CT results are back and they show that her left upper salivary gland contains a small nodule, there will need to be investigated further.  The radiologist suggested referral to ENT and I did place this referral (I hope she agrees with this referral).  I am forwarding this note to Bonnita Nasuti to arrange this.

## 2019-05-23 NOTE — Addendum Note (Signed)
Addended by: Philemon Kingdom on: 05/23/2019 02:53 PM   Modules accepted: Orders

## 2019-05-23 NOTE — Telephone Encounter (Signed)
Spoke to patient and read the message from Dr. Cruzita Lederer verbatim.  Patient expressed understanding and agreement with referral to ENT. Patient had no further questions.

## 2019-06-10 ENCOUNTER — Encounter (INDEPENDENT_AMBULATORY_CARE_PROVIDER_SITE_OTHER): Payer: Self-pay | Admitting: Otolaryngology

## 2019-06-10 ENCOUNTER — Ambulatory Visit (INDEPENDENT_AMBULATORY_CARE_PROVIDER_SITE_OTHER): Payer: 59 | Admitting: Otolaryngology

## 2019-06-10 ENCOUNTER — Other Ambulatory Visit: Payer: Self-pay

## 2019-06-10 VITALS — Temp 97.9°F

## 2019-06-10 DIAGNOSIS — D490 Neoplasm of unspecified behavior of digestive system: Secondary | ICD-10-CM | POA: Diagnosis not present

## 2019-06-10 NOTE — Progress Notes (Signed)
HPI: Sharon Moreno is a 48 y.o. female who presents is referred by her PCP for evaluation of left parotid mass.  Patient has noted a nodule on the left side of her face for several months.  She recently had a CT scan that showed a 12 x 15 mm anterior left parotid mass consistent with probable parotid neoplasm.  It has occasionally been tender but is otherwise asymptomatic. She had previous history of thyroidectomy for thyroid cancer in 1996. No adenopathy was noted on the recent CT scan.  No past medical history on file. No past surgical history on file. Social History   Socioeconomic History  . Marital status: Married    Spouse name: Not on file  . Number of children: Not on file  . Years of education: Not on file  . Highest education level: Not on file  Occupational History  . Not on file  Social Needs  . Financial resource strain: Not on file  . Food insecurity    Worry: Not on file    Inability: Not on file  . Transportation needs    Medical: Not on file    Non-medical: Not on file  Tobacco Use  . Smoking status: Never Smoker  . Smokeless tobacco: Never Used  Substance and Sexual Activity  . Alcohol use: No  . Drug use: No  . Sexual activity: Not on file  Lifestyle  . Physical activity    Days per week: Not on file    Minutes per session: Not on file  . Stress: Not on file  Relationships  . Social Herbalist on phone: Not on file    Gets together: Not on file    Attends religious service: Not on file    Active member of club or organization: Not on file    Attends meetings of clubs or organizations: Not on file    Relationship status: Not on file  Other Topics Concern  . Not on file  Social History Narrative  . Not on file   No family history on file. Allergies  Allergen Reactions  . Asa [Aspirin]    Prior to Admission medications   Medication Sig Start Date End Date Taking? Authorizing Provider  levothyroxine (SYNTHROID) 112 MCG tablet Take 1  tablet (112 mcg total) by mouth daily. 04/25/19  Yes Philemon Kingdom, MD  Multiple Vitamin (MULTIVITAMIN WITH MINERALS) TABS Take 1 tablet by mouth daily.   Yes [provider]     Positive ROS: Otherwise negative  All other systems have been reviewed and were otherwise negative with the exception of those mentioned in the HPI and as above.  Physical Exam: Constitutional: Alert, well-appearing, no acute distress Ears: External ears without lesions or tenderness. Ear canals are clear bilaterally with intact, clear TMs.  Nasal: External nose without lesions. Septum relatively midline.. Clear nasal passages Oral: Lips and gums without lesions. Tongue and palate mucosa without lesions. Posterior oropharynx clear.  Indirect laryngoscopy revealed a clear base of tongue vallecula and epiglottis. Neck: No palpable adenopathy or masses.  Patient has an easily palpable 1 and half centimeter left parotid mass just below the zygomatic arch in the front portion of the left parotid gland.  She has normal facial nerve function. Respiratory: Breathing comfortably lungs clear to auscultation. Cardiac exam: Regular rate and rhythm without murmur Skin: No facial/neck lesions or rash noted.  Procedures  Assessment: Left parotid neoplasm.  Plan: I reviewed the CT scan with the patient in  the office today. Discuss left parotidectomy with facial nerve dissection with the patient today and the risk of surgery involving the facial nerve.  I answered any questions she had.  Could consider fine-needle aspirate of the left parotid mass if she decides to wait on having the neoplasm removed.  If she decides to have this removed over the next 3 months do not feel like FNA would be that beneficial.  Clinically this is more consistent with a benign tumor but this will gradually enlarge.  Reviewed surgery with her today in the office. She will call us back if she decides to have the surgery scheduled over the  next couple of months.  If she decides to wait would recommend scheduling ultrasound-guided FNA of the left parotid mass and this can be scheduled through our office.   Radene Journey, MD   CC:

## 2019-06-11 ENCOUNTER — Other Ambulatory Visit: Payer: Self-pay

## 2019-06-13 ENCOUNTER — Other Ambulatory Visit: Payer: Self-pay

## 2019-06-13 ENCOUNTER — Other Ambulatory Visit (HOSPITAL_COMMUNITY)
Admission: RE | Admit: 2019-06-13 | Discharge: 2019-06-13 | Disposition: A | Discharge status: Home / Self Care | Payer: 59 | Source: Ambulatory Visit | Attending: Obstetrics and Gynecology | Admitting: Obstetrics and Gynecology

## 2019-06-13 ENCOUNTER — Encounter: Payer: Self-pay | Admitting: Obstetrics and Gynecology

## 2019-06-13 ENCOUNTER — Ambulatory Visit (INDEPENDENT_AMBULATORY_CARE_PROVIDER_SITE_OTHER): Payer: 59 | Admitting: Obstetrics and Gynecology

## 2019-06-13 VITALS — BP 118/82 | HR 72 | Temp 97.3°F | Ht 61.0 in | Wt 177.4 lb

## 2019-06-13 DIAGNOSIS — Z124 Encounter for screening for malignant neoplasm of cervix: Secondary | ICD-10-CM

## 2019-06-13 DIAGNOSIS — A6000 Herpesviral infection of urogenital system, unspecified: Secondary | ICD-10-CM | POA: Insufficient documentation

## 2019-06-13 DIAGNOSIS — Z01419 Encounter for gynecological examination (general) (routine) without abnormal findings: Secondary | ICD-10-CM

## 2019-06-13 NOTE — Progress Notes (Signed)
48 y.o. G2P2002 Divorced Black or [place] American Not Hispanic or Latino female here for annual exam.  PMP, no vaginal bleeding. Not sexually active currently.    No LMP recorded. Patient is postmenopausal.          Sexually active: No.  The current method of family planning is post menopausal status.    Exercising: Yes.    walking Smoker:  no  Health Maintenance: Pap:  Not sure History of abnormal Pap:  no MMG:  2019 normal per patient BMD:   Never Colonoscopy: Never, she is scheduled to see GI in 1/21. TDaP:  UTD per patient Gardasil: Never   reports that she has never smoked. She has never used smokeless tobacco. She reports that she does not drink alcohol or use drugs. Works for [employer] 55 boys, 50 year old and 50 year old (joint custody).   Past Medical History:  Diagnosis Date  . Thyroid disease   She is seeing an Endocrinolgist  Past Surgical History:  Procedure Laterality Date  . CESAREAN SECTION    . THYROIDECTOMY    . TONSILLECTOMY    C/S x 2  Current Outpatient Medications  Medication Sig Dispense Refill  . levothyroxine (SYNTHROID) 112 MCG tablet Take 1 tablet (112 mcg total) by mouth daily. 45 tablet 3  . Multiple Vitamin (MULTIVITAMIN WITH MINERALS) TABS Take 1 tablet by mouth daily.     No current facility-administered medications for this visit.    Family History  Problem Relation Age of Onset  . Diabetes Maternal Aunt     Review of Systems  Constitutional: Negative.   HENT: Negative.   Eyes: Negative.   Respiratory: Negative.   Cardiovascular: Negative.   Gastrointestinal: Negative.   Endocrine: Negative.   Genitourinary: Negative.   Musculoskeletal: Negative.   Skin: Negative.   Allergic/Immunologic: Negative.   Neurological: Negative.   Hematological: Negative.   Psychiatric/Behavioral: Negative.     Exam:     Weight change: @WEIGHTCHANGE @ Height:   Height: 5\' 1"  (154.9 cm)  Ht Readings from Last 3 Encounters:  06/13/19 5\' 1"  (1.549 m)   04/22/19 5\' 1"  (1.549 m)  10/01/13 5\' 1"  (1.549 m)    General appearance: alert, cooperative and appears stated age Head: Normocephalic, without obvious abnormality, atraumatic Neck: no adenopathy, supple, symmetrical, trachea midline and thyroid thyroid absent Lungs: clear to auscultation bilaterally Cardiovascular: regular rate and rhythm Breasts: normal appearance, no masses or tenderness, she does have some increase in nodularity in the left breast at 9 o'clock when compared to the right. Prior negative evaluation, no change per patient.  Abdomen: soft, non-tender; non distended,  no masses,  no organomegaly Extremities: extremities normal, atraumatic, no cyanosis or edema Skin: Skin color, texture, turgor normal. No rashes or lesions Lymph nodes: Cervical, supraclavicular, and axillary nodes normal. No abnormal inguinal nodes palpated Neurologic: Grossly normal   Pelvic: External genitalia:  no lesions              Urethra:  normal appearing urethra with no masses, tenderness or lesions              Bartholins and Skenes: normal                 Vagina: normal appearing vagina with normal color and discharge, no lesions              Cervix: no lesions               Bimanual Exam:  Uterus:  normal size, contour, position, consistency, mobility, non-tender and anteverted              Adnexa: no mass, fullness, tenderness               Rectovaginal: Confirms               Anus:  normal sphincter tone, no lesions  Terra chaperoned for the exam.  A:  Well Woman with normal exam  P:   Pap with hpv  Discussed breast self exam  Discussed calcium and vit D intake  Labs with Primary and Endocrinology  Mammogram due, # given  Colonoscopy being scheduled

## 2019-06-13 NOTE — Addendum Note (Signed)
Addended by: Dorothy Spark on: 06/13/2019 05:09 PM   Modules accepted: Orders

## 2019-06-13 NOTE — Patient Instructions (Signed)
EXERCISE AND DIET:  We recommended that you start or continue a regular exercise program for good health. Regular exercise means any activity that makes your heart beat faster and makes you sweat.  We recommend exercising at least 30 minutes per day at least 3 days a week, preferably 4 or 5.  We also recommend a diet low in fat and sugar.  Inactivity, poor dietary choices and obesity can cause diabetes, heart attack, stroke, and kidney damage, among others.    ALCOHOL AND SMOKING:  Women should limit their alcohol intake to no more than 7 drinks/beers/glasses of wine (combined, not each!) per week. Moderation of alcohol intake to this level decreases your risk of breast cancer and liver damage. And of course, no recreational drugs are part of a healthy lifestyle.  And absolutely no smoking or even second hand smoke. Most people know smoking can cause heart and lung diseases, but did you know it also contributes to weakening of your bones? Aging of your skin?  Yellowing of your teeth and nails?  CALCIUM AND VITAMIN D:  Adequate intake of calcium and Vitamin D are recommended.  The recommendations for exact amounts of these supplements seem to change often, but generally speaking 1,200 mg of calcium (between diet and supplement) and 800 units of Vitamin D per day seems prudent. Certain women may benefit from higher intake of Vitamin D.  If you are among these women, your doctor will have told you during your visit.    PAP SMEARS:  Pap smears, to check for cervical cancer or precancers,  have traditionally been done yearly, although recent scientific advances have shown that most women can have pap smears less often.  However, every woman still should have a physical exam from her gynecologist every year. It will include a breast check, inspection of the vulva and vagina to check for abnormal growths or skin changes, a visual exam of the cervix, and then an exam to evaluate the size and shape of the uterus and  ovaries.  And after 50 years of age, a rectal exam is indicated to check for rectal cancers. We will also provide age appropriate advice regarding health maintenance, like when you should have certain vaccines, screening for sexually transmitted diseases, bone density testing, colonoscopy, mammograms, etc.   MAMMOGRAMS:  All women over 40 years old should have a yearly mammogram. Many facilities now offer a "3D" mammogram, which may cost around $50 extra out of pocket. If possible,  we recommend you accept the option to have the 3D mammogram performed.  It both reduces the number of women who will be called back for extra views which then turn out to be normal, and it is better than the routine mammogram at detecting truly abnormal areas.    COLON CANCER SCREENING: Now recommend starting at age 45. At this time colonoscopy is not covered for routine screening until 50. There are take home tests that can be done between 45-49.   COLONOSCOPY:  Colonoscopy to screen for colon cancer is recommended for all women at age 50.  We know, you hate the idea of the prep.  We agree, BUT, having colon cancer and not knowing it is worse!!  Colon cancer so often starts as a polyp that can be seen and removed at colonscopy, which can quite literally save your life!  And if your first colonoscopy is normal and you have no family history of colon cancer, most women don't have to have it again for   10 years.  Once every ten years, you can do something that may end up saving your life, right?  We will be happy to help you get it scheduled when you are ready.  Be sure to check your insurance coverage so you understand how much it will cost.  It may be covered as a preventative service at no cost, but you should check your particular policy.      Breast Self-Awareness Breast self-awareness means being familiar with how your breasts look and feel. It involves checking your breasts regularly and reporting any changes to your  health care provider. Practicing breast self-awareness is important. A change in your breasts can be a sign of a serious medical problem. Being familiar with how your breasts look and feel allows you to find any problems early, when treatment is more likely to be successful. All women should practice breast self-awareness, including women who have had breast implants. How to do a breast self-exam One way to learn what is normal for your breasts and whether your breasts are changing is to do a breast self-exam. To do a breast self-exam: Look for Changes  1. Remove all the clothing above your waist. 2. Stand in front of a mirror in a room with good lighting. 3. Put your hands on your hips. 4. Push your hands firmly downward. 5. Compare your breasts in the mirror. Look for differences between them (asymmetry), such as: ? Differences in shape. ? Differences in size. ? Puckers, dips, and bumps in one breast and not the other. 6. Look at each breast for changes in your skin, such as: ? Redness. ? Scaly areas. 7. Look for changes in your nipples, such as: ? Discharge. ? Bleeding. ? Dimpling. ? Redness. ? A change in position. Feel for Changes Carefully feel your breasts for lumps and changes. It is best to do this while lying on your back on the floor and again while sitting or standing in the shower or tub with soapy water on your skin. Feel each breast in the following way:  Place the arm on the side of the breast you are examining above your head.  Feel your breast with the other hand.  Start in the nipple area and make  inch (2 cm) overlapping circles to feel your breast. Use the pads of your three middle fingers to do this. Apply light pressure, then medium pressure, then firm pressure. The light pressure will allow you to feel the tissue closest to the skin. The medium pressure will allow you to feel the tissue that is a little deeper. The firm pressure will allow you to feel the tissue  close to the ribs.  Continue the overlapping circles, moving downward over the breast until you feel your ribs below your breast.  Move one finger-width toward the center of the body. Continue to use the  inch (2 cm) overlapping circles to feel your breast as you move slowly up toward your collarbone.  Continue the up and down exam using all three pressures until you reach your armpit.  Write Down What You Find  Write down what is normal for each breast and any changes that you find. Keep a written record with breast changes or normal findings for each breast. By writing this information down, you do not need to depend only on memory for size, tenderness, or location. Write down where you are in your menstrual cycle, if you are still menstruating. If you are having trouble noticing differences   in your breasts, do not get discouraged. With time you will become more familiar with the variations in your breasts and more comfortable with the exam. How often should I examine my breasts? Examine your breasts every month. If you are breastfeeding, the best time to examine your breasts is after a feeding or after using a breast pump. If you menstruate, the best time to examine your breasts is 5-7 days after your period is over. During your period, your breasts are lumpier, and it may be more difficult to notice changes. When should I see my health care provider? See your health care provider if you notice:  A change in shape or size of your breasts or nipples.  A change in the skin of your breast or nipples, such as a reddened or scaly area.  Unusual discharge from your nipples.  A lump or thick area that was not there before.  Pain in your breasts.  Anything that concerns you.  

## 2019-06-18 LAB — CYTOLOGY - PAP
Comment: NEGATIVE
Diagnosis: NEGATIVE
High risk HPV: NEGATIVE

## 2019-06-24 ENCOUNTER — Telehealth: Payer: Self-pay

## 2019-06-24 NOTE — Telephone Encounter (Signed)
Pt called stating her Levothyroxine was never called in to her pharmacy. Expressed frustration stating she is getting the run around from our office and the pharmacy. Reassured that it appears a refill request did not come electronically nor had been denied. Further advised, it appears she was last seen 04/22/19, dosage was changed to 170mcg at that visit and submitted to her pharmacy. In addition, read the results to the pt and reminded her that repeat labs were required approx 45 days from 04/22/19 results. To date, it appears labs have not been obtained. Advised I would forward message to Dr. Cruzita Lederer to address if she would be willing to refill WITHOUT updated labs. Pt then cut me off and stated, "well, tell her this. I have not taken it for 2 weeks so they won't be right."

## 2019-06-24 NOTE — Telephone Encounter (Signed)
I am not sure what happened.  I sent the prescription to her pharmacy after I received the results, right after the visit.  I am not sure why she is out of the medication.  She has refills... Please call again again and, after she restarts it, we need labs in 1.5 months.

## 2019-06-25 NOTE — Telephone Encounter (Signed)
Spoke to patient, pharmacy has the RX and she will pick it up today.

## 2019-10-15 ENCOUNTER — Telehealth: Payer: Self-pay | Admitting: Internal Medicine

## 2019-10-15 NOTE — Telephone Encounter (Signed)
Patient called requesting a referral to go to another doctor instead of Dr Lucia Gaskins (ENT) - she has been seen once by them and after that she said she's been having no luck getting through to them to schedule. Patient ph# [phone]

## 2019-10-16 ENCOUNTER — Other Ambulatory Visit: Payer: Self-pay | Admitting: Internal Medicine

## 2019-10-16 DIAGNOSIS — R221 Localized swelling, mass and lump, neck: Secondary | ICD-10-CM

## 2019-10-16 NOTE — Telephone Encounter (Signed)
I put in the referral in, generic, did not refer her to any particular doctor, just to ENT.  Please forward this to the referral person in the office (I am not sure what this is these days).

## 2019-10-16 NOTE — Telephone Encounter (Signed)
I don't think we have a referral pool, I will forward to [place].

## 2019-10-16 NOTE — Telephone Encounter (Signed)
Patient has an appointment with Dr Lucia Gaskins on 10-22-19 at 10:15am.  [facility] for appointment info.

## 2019-10-16 NOTE — Telephone Encounter (Signed)
This referral is in our outgoing que. Cyndi is working those now She will do it today

## 2019-10-16 NOTE — Telephone Encounter (Signed)
OK, Ty

## 2019-10-21 ENCOUNTER — Other Ambulatory Visit: Payer: Self-pay

## 2019-10-22 ENCOUNTER — Ambulatory Visit (INDEPENDENT_AMBULATORY_CARE_PROVIDER_SITE_OTHER): Payer: 59 | Admitting: Otolaryngology

## 2019-10-22 ENCOUNTER — Encounter: Payer: Self-pay | Admitting: Internal Medicine

## 2019-10-22 ENCOUNTER — Ambulatory Visit: Payer: 59 | Admitting: Internal Medicine

## 2019-10-22 VITALS — BP 120/88 | HR 77 | Ht 61.0 in | Wt 176.0 lb

## 2019-10-22 DIAGNOSIS — E890 Postprocedural hypothyroidism: Secondary | ICD-10-CM | POA: Diagnosis not present

## 2019-10-22 DIAGNOSIS — Z833 Family history of diabetes mellitus: Secondary | ICD-10-CM

## 2019-10-22 DIAGNOSIS — C73 Malignant neoplasm of thyroid gland: Secondary | ICD-10-CM

## 2019-10-22 DIAGNOSIS — K118 Other diseases of salivary glands: Secondary | ICD-10-CM

## 2019-10-22 LAB — T4, FREE: Free T4: 1.16 ng/dL (ref 0.60–1.60)

## 2019-10-22 LAB — HEMOGLOBIN A1C: Hgb A1c MFr Bld: 5.5 % (ref 4.6–6.5)

## 2019-10-22 LAB — TSH: TSH: 1.33 u[IU]/mL (ref 0.35–4.50)

## 2019-10-22 NOTE — Progress Notes (Addendum)
Patient ID: Sharon Moreno, female   DOB: February 18, 1971, 49 y.o.   MRN: [id number]   This visit occurred during the SARS-CoV-2 public health emergency.  Safety protocols were in place, including screening questions prior to the visit, additional usage of staff PPE, and extensive cleaning of exam room while observing appropriate contact time as indicated for disinfecting solutions.   HPI  Sharon Moreno is a 49 y.o.-year-old female, initially referred by her PCP, Dr. Lujean Amel, returning for follow-up for papillary thyroid cancer and postsurgical hypothyroidism.  Patient was previously seen by Dr. Gilberto Better, in [phone], Dr. Dwyane Dee, last visit 5 years ago.  She also saw Dr. Awilda Metro from 2016-2018.  Our first visit was 6 months ago.  Pt. has been dx with papillary thyroid carcinoma in 1986.  She had 3 RAI treatments.  She also had posttreatment whole-body scans-latest recombinant TSH stimulated whole-body scan was in 2017 and this was negative for metastasis or recurrence.  Reviewed and addended her cancer history: 1998: Thyroid cancer diagnosed by biopsy, after a thyroid nodule was observed by her mother.   10/1996: Total thyroidectomy: PTC, No further information is available about the pathology 10/1996: RAI tx with 100 mCi 2000: WBS: Negative 05/2001: RAI tx with 100 mCi 03/2002: RAI tx with 150 mCi 05/15/2003: Neck ultrasound: Ultrasound examination demonstrates two nodules in the left side of the neck deep to the anterior musculature where the left lobe of the thyroid gland previously resided. This is just medial to the jugular vein. One of these nodules is 5.5 mm in diameter and the other is just inferior and slightly deeper and measures 5.0 mm. I suspect these represent recurrence of tumor.   No other abnormality. No evidence of nodules seen to the right of the trachea.   IMPRESSION Two nodules to the left of the trachea at the site of the previous left lobe of the thyroid gland. This is  worrisome for tumor recurrence. 2007: WBS: Negative 08/18/2015: rTSH stimulated WBS: Negative  08/14/2016: Neck ultrasound: Parenchymal Echotexture: Surgically absent Isthmus: Surgically absent Right lobe: Surgically absent Left lobe: Surgically absent There is a 0.5 x0.3 x 0.4 cm soft tissue nodule in the thyroidectomy bed just to the left of midline (previously 7 x 3 x 6 mm). IMPRESSION: 1. No interval enlargement of small nonspecific nodule presumably residual/recurrent in the left thyroidectomy bed. 04/25/2019: Neck ultrasound: The thyroid gland is surgically absent. Sonographic interrogation of the right and left resection beds demonstrates no evidence of residual or recurrent thyroid tissue or nodularity. There is no evidence of lymphadenopathy. However, there is a circumscribed anechoic structure with posterior acoustic enhancement in the superior aspect of the left neck measuring 0.6 by 0.7 by 1.2 cm. This has not been previously identified.  In the midline, inferior to the prior thyroid isthmus there is a small hypoechoic solid nodule measuring 0.4 x 0.3 x 0.4 cm. This has slightly decreased in size compared to January of 2017 when the abnormality measured 0.7 x 0.3 x 0.6 cm.  IMPRESSION: 1. Slowly decreasing size of small nodule inferior to the prior thyroid isthmus compared to prior studies dating back to January of 2017. Involution over time suggests a benign process. 2. Nonspecific simple appearing cyst in the superior aspect of the right neck measures 1.2 x 0.6 x 0.7 cm and has not been previously identified. This is of uncertain etiology and significance. While this is likely benign, any new cystic lesion in the neck should be evaluated further to exclude  the possibility of necrotic lymphadenopathy. Recommend contrast-enhanced CT scan of the neck. 3. Surgical changes of total thyroidectomy. 05/22/2019: CT neck: 1. Left anterior superior parotid space 12 x 15 mm  enhancing and slightly irregular round soft tissue nodule is most compatible with a small primary parotid neoplasm. Recommend ENT referral. 2. No cystic mass or lymphadenopathy in the neck to correspond to the recent ultrasound. The largest lymph nodes are at the right level 2 station measuring 8-9 mm short axis, and remain normal by CT criteria. 3. Negative thyroid bed. 4. Ectatic distal aortic arch, about 30 mm diameter. Recommend annual imaging followup by Chest CTA or MRA.  Patient was referred to ENT. She will have surgery by Dr. Lucia Gaskins soon.  Also, I sent the results to PCP regarding the distal aortic arch dilation.  Thyroglobulin was detectable while ATA antibodies were negative: Lab Results  Component Value Date   THYROGLB 0.3 (L) 04/22/2019   THYROGLB 0.3 10/01/2013   Lab Results  Component Value Date   THGAB <1 04/22/2019    08/19/2014: Tg 0.5, ATA <0.1 05/05/2015: ATA <1.0  Pt denies: - feeling nodules in neck - hoarseness - dysphagia - choking - SOB with lying down  For her postsurgical hypothyroidism, she is on levothyroxine 112 mcg daily (increased at last visit). - in am - fasting - at least 30 min from b'fast - no Ca, Fe, PPIs - + Occasional multivitamins at night - not on Biotin  I reviewed patient's TFTs -she did not come back for labs after decreasing levothyroxine dose: Lab Results  Component Value Date   TSH 3.04 04/22/2019   TSH 1.99 10/01/2013   TSH 1.21 01/28/2013   FREET4 0.95 04/22/2019   FREET4 1.01 10/01/2013   FREET4 1.02 01/28/2013  01/10/2019: TSH 0.119 11/05/2018: TSH 0.03 08/07/2018: TSH 6.01   At last visit, he described weight gain, hot flashes, diarrhea.  She still has hot flushes.  She has no FH of thyroid disorders. No FH of thyroid cancer. No h/o radiation tx to head or neck.  No herbal supplements. No Biotin use. No recent steroids use.   She continues on vitamin D daily.  LMP 3 years ago.  Sh has FH of  DM.  ROS: Constitutional: no weight gain/no weight loss, no fatigue, + subjective hyperthermia, no subjective hypothermia Eyes: no blurry vision, no xerophthalmia ENT: no sore throat, + see HPI Cardiovascular: no CP/no SOB/no palpitations/no leg swelling Respiratory: no cough/no SOB/no wheezing Gastrointestinal: no N/no V/no D/no C/no acid reflux Musculoskeletal: no muscle aches/no joint aches Skin: no rashes, no hair loss Neurological: no tremors/no numbness/no tingling/no dizziness  I reviewed pt's medications, allergies, PMH, social hx, family hx, and changes were documented in the history of present illness. Otherwise, unchanged from my initial visit note.  Past Medical History:  Diagnosis Date  . Thyroid disease     Surgeries: -Please see HPI  Social History   Socioeconomic History  . Marital status: Married    Spouse name: Not on file  . Number of children: 2: 80 and 12 years old in 04/2019  . Years of education: Not on file  . Highest education level: Not on file  Occupational History  .  Caseworker for social services  Social Needs  . Financial resource strain: Not on file  . Food insecurity    Worry: Not on file    Inability: Not on file  . Transportation needs    Medical: Not on file    Non-medical:  Not on file  Tobacco Use  . Smoking status: Never Smoker  . Smokeless tobacco: Never Used  Substance and Sexual Activity  . Alcohol use: No  . Drug use: No   Current Outpatient Medications on File Prior to Visit  Medication Sig Dispense Refill  . levothyroxine (SYNTHROID) 112 MCG tablet Take 1 tablet (112 mcg total) by mouth daily. 45 tablet 3  . Multiple Vitamin (MULTIVITAMIN WITH MINERALS) TABS Take 1 tablet by mouth daily.     No current facility-administered medications on file prior to visit.   No Active Allergies Family history: -Diabetes in aunt and maternal grandfather  PE: BP 120/88   Pulse 77   Ht '5\' 1"'  (1.549 m)   Wt 176 lb (79.8 kg)    LMP 09/30/2013   SpO2 99%   BMI 33.25 kg/m  Wt Readings from Last 3 Encounters:  10/22/19 176 lb (79.8 kg)  06/13/19 177 lb 6.4 oz (80.5 kg)  04/22/19 179 lb (81.2 kg)   Constitutional: overweight, in NAD Eyes: PERRLA, EOMI, no exophthalmos ENT: moist mucous membranes, no neck mass palpated, thyroidectomy scar healed, no cervical lymphadenopathy Cardiovascular: RRR, No MRG Respiratory: CTA B Gastrointestinal: abdomen soft, NT, ND, BS+ Musculoskeletal: no deformities, strength intact in all 4 Skin: moist, warm, no rashes Neurological: no tremor with outstretched hands, DTR normal in all 4  ASSESSMENT: 1. Thyroid cancer - see HPI  2. Postsurgical Hypothyroidism  3.  Parotid mass  4. FH of DM2  PLAN:  1. Thyroid cancer - papillary - She is now in remission for her papillary thyroid cancer.  Her thyroglobulin level is still detectable. -I reassured her that papillary thyroid cancer is a slow growing cancer with good prognosis; her life expectancy or quality of life is unlikely to be reduced due to the cancer.  -Unfortunately I do not have details about the tumor size or pathology, but she did have 3 RAI treatments with persistently detectable thyroglobulin. I explained that the main role of this is to facilitate monitoring in the long run (by checking thyroglobulin).  If the thyroglobulin is detectable, there are most likely persistent cancer cells, but they have not been localized by previous imaging tests.   At last visit, we checked a neck ultrasound that showed that her previously seen thyroid bed mass has involuted, and is likely benign in nature.  This also showed an upper neck mass (see problem #3) -We will continue to follow this by thyroglobulin levels.  We will recheck this at next visit. - I will then see the patient in approximately 6 months.  2.  Patient with history of total thyroidectomy for thyroid cancer, now with iatrogenic hypothyroidism, on levothyroxine 112 mcg  daily, dose increased at last visit.  She did not return in 1.5 months for repeat labs, as advised. - latest thyroid labs reviewed with pt >> normal, but above our target >> LT4dose was increased afterwards: Lab Results  Component Value Date   TSH 3.04 04/22/2019  - pt feels good on this dose. - we discussed about taking the thyroid hormone every day, with water, >30 minutes before breakfast, separated by >4 hours from acid reflux medications, calcium, iron, multivitamins. Pt. is taking it correctly. - will check thyroid tests today: TSH and fT4-target TSH in the lower part of the normal interval or even slightly under the lower limit of normal. - If labs are abnormal, she will need to return for repeat TFTs in 1.5 months  3.  Parotid mass -  Patient had an upper neck mass incidentally seen on neck ultrasound -CT of the neck showed a possible parotid neoplasm -I referred her to ENT >> she saw Dr. Lucia Gaskins.  She will have surgery for this.  4. FH of DM2 -She occasionally feels numbness in her feet when she stands up from a sitting position -She has a family history of diabetes and would like to be tested for this -We will check an HbA1c  Office Visit on 10/22/2019  Component Date Value Ref Range Status  . Hgb A1c MFr Bld 10/22/2019 5.5  4.6 - 6.5 % Final   Glycemic Control Guidelines for People with Diabetes:Non Diabetic:  <6%Goal of Therapy: <7%Additional Action Suggested:  >8%   . TSH 10/22/2019 1.33  0.35 - 4.50 uIU/mL Final  . Free T4 10/22/2019 1.16  0.60 - 1.60 ng/dL Final   Comment: Specimens from patients who are undergoing biotin therapy and /or ingesting biotin supplements may contain high levels of biotin.  The higher biotin concentration in these specimens interferes with this Free T4 assay.  Specimens that contain high levels  of biotin may cause false high results for this Free T4 assay.  Please interpret results in light of the total clinical presentation of the patient.      Normal HbA1c.  Also, normal TFTs. TSH lower than before.  Philemon Kingdom, MD PhD [facility]

## 2019-10-22 NOTE — Patient Instructions (Signed)
Please stop at the lab.  Please continue Levothyroxine 112 mcg daily.  Take the thyroid hormone every day, with water, at least 30 minutes before breakfast, separated by at least 4 hours from: - acid reflux medications - calcium - iron - multivitamins  Please come back for a follow-up appointment in 6 months 

## 2019-10-23 ENCOUNTER — Ambulatory Visit: Payer: 59 | Admitting: Internal Medicine

## 2019-11-04 ENCOUNTER — Other Ambulatory Visit (INDEPENDENT_AMBULATORY_CARE_PROVIDER_SITE_OTHER): Payer: Self-pay

## 2019-11-04 DIAGNOSIS — D37030 Neoplasm of uncertain behavior of the parotid salivary glands: Secondary | ICD-10-CM

## 2019-11-04 NOTE — Progress Notes (Signed)
u

## 2019-11-05 NOTE — Progress Notes (Signed)
Sharon Moreno Female, 49 y.o., 1971-03-30 MRN:  [id number] Phone:  [phone] [name]) PCP:  Lujean Amel, MD Coverage:  [place] [facility] Other Next Appt With Internal Medicine 04/23/2020 at 3:40 PM  RE: Biopsy Received: Today Message Contents  Arne Cleveland, MD  [name]   [place] FNA L parotid nodule   DDH   Previous Messages  ----- Message -----  From: Lenore Cordia  Sent: 11/05/2019 12:07 PM EDT  To: Ir Procedure Requests  Subject: Biopsy                      Procedure Requested: [place] FNA    Reason for Procedure: Neoplasma of uncertain behavior of left parotid gland    Provider Requesting:  Rozetta Nunnery    Provider Telephone: [phone]    Other Info:

## 2019-11-07 ENCOUNTER — Encounter (HOSPITAL_COMMUNITY): Payer: Self-pay

## 2019-11-07 NOTE — Progress Notes (Unsigned)
Sharon Moreno Female, 49 y.o., 1970/09/01 MRN:  [id number] Phone:  [phone] [name]) PCP:  Lujean Amel, MD Coverage:  [place] [facility]/[employer] Other Next Appt With Radiology ([facility]) 11/18/2019 at 8:00 AM  RE: Biopsy Received: 2 days ago Message Contents  Sharon Cleveland, MD  Sharon Moreno   [place] FNA L parotid nodule   DDH   Previous Messages  ----- Message -----  From: Sharon Moreno  Sent: 11/05/2019 12:07 PM EDT  To: Sharon Moreno  Subject: Biopsy                      Procedure Requested: [place] FNA    Reason for Procedure: Neoplasma of uncertain behavior of left parotid gland    Provider Requesting:  Sharon Moreno    Provider Telephone: [phone]    Other Info:

## 2019-11-17 ENCOUNTER — Other Ambulatory Visit: Payer: Self-pay | Admitting: Radiology

## 2019-11-18 ENCOUNTER — Other Ambulatory Visit: Payer: Self-pay | Admitting: Radiology

## 2019-11-18 ENCOUNTER — Ambulatory Visit (HOSPITAL_COMMUNITY): Admission: RE | Admit: 2019-11-18 | Payer: 59 | Source: Ambulatory Visit

## 2019-11-27 ENCOUNTER — Other Ambulatory Visit: Payer: Self-pay | Admitting: Physician Assistant

## 2019-11-28 ENCOUNTER — Other Ambulatory Visit: Payer: Self-pay

## 2019-11-28 ENCOUNTER — Ambulatory Visit (HOSPITAL_COMMUNITY)
Admission: RE | Admit: 2019-11-28 | Discharge: 2019-11-28 | Disposition: A | Discharge status: Home / Self Care | Payer: 59 | Source: Ambulatory Visit | Attending: Otolaryngology | Admitting: Otolaryngology

## 2019-11-28 DIAGNOSIS — Z85850 Personal history of malignant neoplasm of thyroid: Secondary | ICD-10-CM | POA: Insufficient documentation

## 2019-11-28 DIAGNOSIS — K118 Other diseases of salivary glands: Secondary | ICD-10-CM | POA: Insufficient documentation

## 2019-11-28 DIAGNOSIS — D37030 Neoplasm of uncertain behavior of the parotid salivary glands: Secondary | ICD-10-CM | POA: Diagnosis not present

## 2019-11-28 DIAGNOSIS — Z7982 Long term (current) use of aspirin: Secondary | ICD-10-CM | POA: Insufficient documentation

## 2019-11-28 MED ORDER — LIDOCAINE HCL (PF) 1 % IJ SOLN
INTRAMUSCULAR | Status: AC
  Filled 2019-11-28: qty 30

## 2019-11-28 MED ORDER — MIDAZOLAM HCL 2 MG/2ML IJ SOLN
INTRAMUSCULAR | Status: AC | PRN
  Administered 2019-11-28: 1 mg via INTRAVENOUS
  Administered 2019-11-28 (×2): 0.5 mg via INTRAVENOUS

## 2019-11-28 MED ORDER — SODIUM CHLORIDE 0.9 % IV SOLN: INTRAVENOUS | Status: DC

## 2019-11-28 MED ORDER — MIDAZOLAM HCL 2 MG/2ML IJ SOLN
INTRAMUSCULAR | Status: AC
  Filled 2019-11-28: qty 4

## 2019-11-28 MED ORDER — FENTANYL CITRATE (PF) 100 MCG/2ML IJ SOLN
INTRAMUSCULAR | Status: AC
  Filled 2019-11-28: qty 2

## 2019-11-28 MED ORDER — FENTANYL CITRATE (PF) 100 MCG/2ML IJ SOLN
INTRAMUSCULAR | Status: AC | PRN
  Administered 2019-11-28 (×2): 25 ug via INTRAVENOUS
  Administered 2019-11-28: 50 ug via INTRAVENOUS

## 2019-11-28 MED ORDER — SODIUM CHLORIDE 0.9 % IV SOLN
INTRAVENOUS | Status: AC | PRN
  Administered 2019-11-28: 10 mL/h via INTRAVENOUS

## 2019-11-28 NOTE — Discharge Instructions (Addendum)
Moderate Conscious Sedation, Adult Sedation is the use of medicines to promote relaxation and relieve discomfort and anxiety. Moderate conscious sedation is a type of sedation. Under moderate conscious sedation, you are less alert than normal, but you are still able to respond to instructions, touch, or both. Moderate conscious sedation is used during short medical and dental procedures. It is milder than deep sedation, which is a type of sedation under which you cannot be easily woken up. It is also milder than general anesthesia, which is the use of medicines to make you unconscious. Moderate conscious sedation allows you to return to your regular activities sooner. Tell a health care provider about:  Any allergies you have.  All medicines you are taking, including vitamins, herbs, eye drops, creams, and over-the-counter medicines.  Use of steroids (by mouth or creams).  Any problems you or family members have had with sedatives and anesthetic medicines.  Any blood disorders you have.  Any surgeries you have had.  Any medical conditions you have, such as sleep apnea.  Whether you are pregnant or may be pregnant.  Any use of cigarettes, alcohol, marijuana, or street drugs. What are the risks? Generally, this is a safe procedure. However, problems may occur, including:  Getting too much medicine (oversedation).  Nausea.  Allergic reaction to medicines.  Trouble breathing. If this happens, a breathing tube may be used to help with breathing. It will be removed when you are awake and breathing on your own.  Heart trouble.  Lung trouble. What happens before the procedure? Staying hydrated Follow instructions from your health care provider about hydration, which may include:  Up to 2 hours before the procedure - you may continue to drink clear liquids, such as water, clear fruit juice, black coffee, and plain tea. Eating and drinking restrictions Follow instructions from your  health care provider about eating and drinking, which may include:  8 hours before the procedure - stop eating heavy meals or foods such as meat, fried foods, or fatty foods.  6 hours before the procedure - stop eating light meals or foods, such as toast or cereal.  6 hours before the procedure - stop drinking milk or drinks that contain milk.  2 hours before the procedure - stop drinking clear liquids. Medicine Ask your health care provider about:  Changing or stopping your regular medicines. This is especially important if you are taking diabetes medicines or blood thinners.  Taking medicines such as aspirin and ibuprofen. These medicines can thin your blood. Do not take these medicines before your procedure if your health care provider instructs you not to.  Tests and exams  You will have a physical exam.  You may have blood tests done to show: ? How well your kidneys and liver are working. ? How well your blood can clot. General instructions  Plan to have someone take you home from the hospital or clinic.  If you will be going home right after the procedure, plan to have someone with you for 24 hours. What happens during the procedure?  An IV tube will be inserted into one of your veins.  Medicine to help you relax (sedative) will be given through the IV tube.  The medical or dental procedure will be performed. What happens after the procedure?  Your blood pressure, heart rate, breathing rate, and blood oxygen level will be monitored often until the medicines you were given have worn off.  Do not drive for 24 hours. This information is not   intended to replace advice given to you by your health care provider. Make sure you discuss any questions you have with your health care provider. Document Revised: 06/01/2017 Document Reviewed: 10/09/2015 Elsevier Patient Education  2020 [employer] Needle Biopsy, Care After These instructions tell you how to care for yourself  after your procedure. Your doctor may also give you more specific instructions. Call your doctor if you have any problems or questions. What can I expect after the procedure? After the procedure, it is common to have:  Soreness.  Bruising.  Mild pain. Follow these instructions at home:   Return to your normal activities as told by your doctor. Ask your doctor what activities are safe for you.  Take over-the-counter and prescription medicines only as told by your doctor.  Wash your hands with soap and water before you change your bandage (dressing). If you cannot use soap and water, use hand sanitizer.  Follow instructions from your doctor about: ? How to take care of your puncture site. ? When and how to change your bandage. ? When to remove your bandage.  Check your puncture site every day for signs of infection. Watch for: ? Redness, swelling, or pain. ? Fluid or blood. ? Pus or a bad smell. ? Warmth.  Do not take baths, swim, or use a hot tub until your doctor approves. Ask your doctor if you may take showers. You may only be allowed to take sponge baths.  Keep all follow-up visits as told by your doctor. This is important. Contact a doctor if you have:  A fever.  Redness, swelling, or pain at the puncture site, and it lasts longer than a few days.  Fluid, blood, or pus coming from the puncture site.  Warmth coming from the puncture site. Get help right away if:  You have a lot of bleeding from the puncture site. Summary  After the procedure, it is common to have soreness, bruising, or mild pain at the puncture site.  Check your puncture site every day for signs of infection, such as redness, swelling, or pain.  Get help right away if you have severe bleeding from your puncture site. This information is not intended to replace advice given to you by your health care provider. Make sure you discuss any questions you have with your health care provider. Document  Revised: 07/02/2017 Document Reviewed: 07/02/2017 Elsevier Patient Education  2020 [employer] Inc.  

## 2019-11-28 NOTE — Sedation Documentation (Signed)
Patient is resting comfortably. 

## 2019-11-28 NOTE — H&P (Addendum)
Chief Complaint: Patient was seen in consultation today for left parotid gland mass/biopsy.  Referring Physician(s): Newman,Christopher E  Supervising Physician: Aletta Edouard  Patient Status: [facility] - Out-pt  History of Present Illness: Sharon Moreno is a 49 y.o. female with a past medical history of papillary thyroid cancer s/p total thyroidectomy 1998 with associated postsurgical hypothyroidism. Approximately 6 months ago, patient noted a mass on her left cheek. She went to her PCP who ordered appropriate imaging scans for further evaluation which revealed a left parotid gland mass. She was then referred to ENT for further management who recommended FNA of mass versus left parotidectomy with facial nerve dissection for management. At that time, patient decided to pursue FNA of mass.  US thyroid 04/25/2019: 1. Slowly decreasing size of small nodule inferior to the prior thyroid isthmus compared to prior studies dating back to January of 2017. Involution over time suggests a benign process. 2. Nonspecific simple appearing cyst in the superior aspect of the right neck measures 1.2 x 0.6 x 0.7 cm and has not been previously identified. This is of uncertain etiology and significance. While this is likely benign, any new cystic lesion in the neck should be evaluated further to exclude the possibility of necrotic lymphadenopathy. Recommend contrast-enhanced CT scan of the neck. 3. Surgical changes of total thyroidectomy.  CT soft tissue neck 05/21/2019: 1. 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 Recommend ENT referral. 2. No cystic mass or lymphadenopathy in the neck to correspond to the recent ultrasound. The largest lymph nodes are at the right level 2 station measuring 8-9 mm short axis, and remain normal by CT criteria. 3. Negative thyroid bed. 4. Ectatic distal  aortic arch, about 30 mm diameter. Recommend annual imaging followup by Chest CTA or MRA.  IR requested by Dr. Lucia Gaskins for possible image-guided left parotid gland mass biopsy. Patient awake and alert sitting in bed with no complaints at this time. Denies fever, chills, chest pain, dyspnea, abdominal pain, or headache.   Past Medical History:  Diagnosis Date  . Thyroid disease     Past Surgical History:  Procedure Laterality Date  . CESAREAN SECTION    . THYROIDECTOMY    . TONSILLECTOMY      Allergies: Patient has no known allergies.  Medications: Prior to Admission medications   Medication Sig Start Date End Date Taking? Authorizing Provider  aspirin 81 MG chewable tablet Chew 81 mg by mouth daily as needed for moderate pain.   Yes [provider]  levothyroxine (SYNTHROID) 112 MCG tablet Take 1 tablet (112 mcg total) by mouth daily. 04/25/19  Yes Philemon Kingdom, MD  Multiple Vitamin (MULTIVITAMIN WITH MINERALS) TABS Take 1 tablet by mouth daily.   Yes [provider]     Family History  Problem Relation Age of Onset  . Diabetes Maternal Aunt     Social History   Socioeconomic History  . Marital status: Single    Spouse name: Not on file  . Number of children: Not on file  . Years of education: Not on file  . Highest education level: Not on file  Occupational History  . Not on file  Tobacco Use  . Smoking status: Never Smoker  . Smokeless tobacco: Never Used  Substance and Sexual Activity  . Alcohol use: No  . Drug use: No  . Sexual activity: Not Currently  Birth control/protection: Post-menopausal  Other Topics Concern  . Not on file  Social History Narrative  . Not on file   Social Determinants of Health   Financial Resource Strain:   . Difficulty of Paying Living Expenses:   Food Insecurity:   . Worried About Charity fundraiser in the Last Year:   . Arboriculturist in the Last Year:   Transportation Needs:   . Lexicographer (Medical):   Marland Kitchen Lack of Transportation (Non-Medical):   Physical Activity:   . Days of Exercise per Week:   . Minutes of Exercise per Session:   Stress:   . Feeling of Stress :   Social Connections:   . Frequency of Communication with Friends and Family:   . Frequency of Social Gatherings with Friends and Family:   . Attends Religious Services:   . Active Member of Clubs or Organizations:   . Attends Archivist Meetings:   Marland Kitchen Marital Status:      Review of Systems: A 12 point ROS discussed and pertinent positives are indicated in the HPI above.  All other systems are negative.  Review of Systems  Constitutional: Negative for chills and fever.  Respiratory: Negative for shortness of breath and wheezing.   Cardiovascular: Negative for chest pain and palpitations.  Gastrointestinal: Negative for abdominal pain.  Neurological: Negative for headaches.  Psychiatric/Behavioral: Negative for behavioral problems and confusion.    Vital Signs: BP 112/82   Pulse 86   Temp 97.7 F (36.5 C)   Resp 16   Ht 5\' 1"  (1.549 m)   Wt 175 lb (79.4 kg)   LMP 09/30/2013   SpO2 100%   BMI 33.07 kg/m   Physical Exam Vitals and nursing note reviewed.  Constitutional:      General: She is not in acute distress.    Appearance: Normal appearance.  Cardiovascular:     Rate and Rhythm: Normal rate and regular rhythm.     Heart sounds: Normal heart sounds. No murmur.  Pulmonary:     Effort: Pulmonary effort is normal. No respiratory distress.     Breath sounds: Normal breath sounds. No wheezing.  Skin:    General: Skin is warm and dry.  Neurological:     Mental Status: She is alert and oriented to person, place, and time.      MD Evaluation Airway: WNL Heart: WNL Abdomen: WNL Chest/ Lungs: WNL ASA  Classification: 3 Mallampati/Airway Score: Two   Imaging: No results found.  Labs:  CBC: No results for input(s): WBC, HGB, HCT, PLT in the last 8760  hours.  COAGS: No results for input(s): INR, APTT in the last 8760 hours.  BMP: No results for input(s): NA, K, CL, CO2, GLUCOSE, BUN, CALCIUM, CREATININE, GFRNONAA, GFRAA in the last 8760 hours.  Invalid input(s): CMP  LIVER FUNCTION TESTS: No results for input(s): BILITOT, AST, ALT, ALKPHOS, PROT, ALBUMIN in the last 8760 hours.  TUMOR MARKERS: No results for input(s): AFPTM, CEA, CA199, CHROMGRNA in the last 8760 hours.  Assessment and Plan:  Left parotid gland mass. Plan for image-guided left parotid gland mass biopsy today in IR. Patient is NPO. Afebrile.  Risks and benefits discussed with the patient including, but not limited to bleeding, infection, damage to adjacent structures or low yield requiring additional tests. All of the patient's questions were answered, patient is agreeable to proceed. Consent signed and in chart.   Thank you for this interesting consult.  I greatly  enjoyed meeting Sharon Moreno and look forward to participating in their care.  A copy of this report was sent to the requesting provider on this date.  Electronically Signed: Earley Abide, PA-C 11/28/2019, 12:36 PM   I spent a total of 30 Minutes in face to face in clinical consultation, greater than 50% of which was counseling/coordinating care for left parotid gland mass/biopsy.

## 2019-11-28 NOTE — Procedures (Signed)
Interventional Radiology Procedure Note  Procedure: US Guided Biopsy of Left parotid gland mass  Complications: None  Estimated Blood Loss: < 10 mL  Findings: FNA of left parotid gland mass performed under US guidance. Four 25 G FNA samples obtained and sent to Pathology.  Venetia Night. Kathlene Cote, M.D Pager:  [phone]

## 2019-12-04 LAB — CYTOLOGY - NON PAP

## 2019-12-08 ENCOUNTER — Ambulatory Visit (INDEPENDENT_AMBULATORY_CARE_PROVIDER_SITE_OTHER): Payer: 59 | Admitting: Otolaryngology

## 2019-12-08 ENCOUNTER — Other Ambulatory Visit: Payer: Self-pay

## 2019-12-08 VITALS — Temp 97.7°F

## 2019-12-08 DIAGNOSIS — D490 Neoplasm of unspecified behavior of digestive system: Secondary | ICD-10-CM

## 2019-12-08 NOTE — Progress Notes (Signed)
HPI: Sharon Moreno is a 49 y.o. female who returns today for evaluation of left parotid mass status post fine-needle aspirate that demonstrated malignant cytology.  Patient gives no previous history of skin cancer or other cancer of the head and neck except for history of papillary thyroid cancer removed about 25 years ago at [facility].  This was treated postoperatively with radioactive iodine.  She has had no evidence of recurrence. However the left parotid mass was demonstrated on a neck CT scan that was performed on 05/21/2019 because of a cystic lesion noted on thyroid ultrasound.  At that time patient was noted to have a 1 cm left parotid mass.  Patient can feel a small lump in front of her left ear and has not noted any significant change in size over the last 6 months. I reviewed the CT scan with the patient in the office today and this demonstrated an approximate 12 mm mass at the anterior aspect of the left parotid gland.  Past Medical History:  Diagnosis Date   Thyroid disease    Past Surgical History:  Procedure Laterality Date   CESAREAN SECTION     THYROIDECTOMY     TONSILLECTOMY     Social History   Socioeconomic History   Marital status: Single    Spouse name: Not on file   Number of children: Not on file   Years of education: Not on file   Highest education level: Not on file  Occupational History   Not on file  Tobacco Use   Smoking status: Never Smoker   Smokeless tobacco: Never Used  Substance and Sexual Activity   Alcohol use: No   Drug use: No   Sexual activity: Not Currently    Birth control/protection: Post-menopausal  Other Topics Concern   Not on file  Social History Narrative   Not on file   Social Determinants of Health   Financial Resource Strain:    Difficulty of Paying Living Expenses:   Food Insecurity:    Worried About Charity fundraiser in the Last Year:    Arboriculturist in the Last Year:    Transportation Needs:    Film/video editor (Medical):    Lack of Transportation (Non-Medical):   Physical Activity:    Days of Exercise per Week:    Minutes of Exercise per Session:   Stress:    Feeling of Stress :   Social Connections:    Frequency of Communication with Friends and Family:    Frequency of Social Gatherings with Friends and Family:    Attends Religious Services:    Active Member of Clubs or Organizations:    Attends Music therapist:    Marital Status:    Family History  Problem Relation Age of Onset   Diabetes Maternal Aunt    No Known Allergies Prior to Admission medications   Medication Sig Start Date End Date Taking? Authorizing Provider  aspirin 81 MG chewable tablet Chew 81 mg by mouth daily as needed for moderate pain.   Yes [provider]  levothyroxine (SYNTHROID) 112 MCG tablet Take 1 tablet (112 mcg total) by mouth daily. 04/25/19  Yes Philemon Kingdom, MD  Multiple Vitamin (MULTIVITAMIN WITH MINERALS) TABS Take 1 tablet by mouth daily.   Yes [provider]     Positive ROS: Otherwise negative  All other systems have been reviewed and were otherwise negative with the exception of those mentioned in the HPI  and as above.  Physical Exam: Constitutional: Alert, well-appearing, no acute distress Ears: External ears without lesions or tenderness. Ear canals are clear bilaterally with no abnormal lesions noted.  TMs are clear. Nasal: External nose without lesions. Clear nasal passages Oral: Lips and gums without lesions. Tongue and palate mucosa without lesions. Posterior oropharynx clear. Neck: No palpable adenopathy or masses.  Thyroid incision is well-healed with no palpable masses. Palpation of the left cheek reveals a small mobile 1 cm nodule a couple centimeters anterior to the tragus.  She has normal facial nerve function. Cardiac exam: Regular rate and rhythm without murmur Respiratory:  Breathing comfortably.  Lungs clear to auscultation Skin: No facial/neck lesions or rash noted.  No previous history of skin cancers of the face or scalp.  Procedures  Assessment: Left parotid mass with FNA revealing malignant cells.  Plan: Reviewed with patient today concerning left parotidectomy with facial nerve suction as well as risk to the facial nerve. We will plan on scheduling this within the next 2 weeks.   Radene Journey, MD

## 2019-12-11 ENCOUNTER — Other Ambulatory Visit: Payer: Self-pay

## 2019-12-11 ENCOUNTER — Encounter (HOSPITAL_BASED_OUTPATIENT_CLINIC_OR_DEPARTMENT_OTHER): Payer: Self-pay | Admitting: Otolaryngology

## 2019-12-12 ENCOUNTER — Ambulatory Visit (INDEPENDENT_AMBULATORY_CARE_PROVIDER_SITE_OTHER): Payer: Self-pay | Admitting: Otolaryngology

## 2019-12-12 DIAGNOSIS — D490 Neoplasm of unspecified behavior of digestive system: Secondary | ICD-10-CM

## 2019-12-12 NOTE — H&P (Signed)
PREOPERATIVE H&P  Chief Complaint: Left parotid mass  HPI: Sharon Moreno is a 49 y.o. female who presents for evaluation of left parotid mass that was initially evaluated 6 to 8 months ago with CT scan that demonstrated a 1 to 1/2 cm left anterior superficial parotid mass.  Fine-needle aspirate was performed and demonstrated malignant cells.  She is taken to the operating room at this time for left parotidectomy and facial nerve dissection.  No neck adenopathy was noted on the CT scan.  She has not noted any significant enlargement of the mass over the last 6 months and she has normal facial nerve function.  Past Medical History:  Diagnosis Date   Hypothyroidism    Thyroid disease    Past Surgical History:  Procedure Laterality Date   CESAREAN SECTION     THYROIDECTOMY     TONSILLECTOMY     Social History   Socioeconomic History   Marital status: Single    Spouse name: Not on file   Number of children: Not on file   Years of education: Not on file   Highest education level: Not on file  Occupational History   Not on file  Tobacco Use   Smoking status: Never Smoker   Smokeless tobacco: Never Used  Vaping Use   Vaping Use: Never used  Substance and Sexual Activity   Alcohol use: No   Drug use: No   Sexual activity: Not Currently    Birth control/protection: Post-menopausal  Other Topics Concern   Not on file  Social History Narrative   Not on file   Social Determinants of Health   Financial Resource Strain:    Difficulty of Paying Living Expenses:   Food Insecurity:    Worried About Charity fundraiser in the Last Year:    Arboriculturist in the Last Year:   Transportation Needs:    Film/video editor (Medical):    Lack of Transportation (Non-Medical):   Physical Activity:    Days of Exercise per Week:    Minutes of Exercise per Session:   Stress:    Feeling of Stress :   Social Connections:    Frequency of Communication with  Friends and Family:    Frequency of Social Gatherings with Friends and Family:    Attends Religious Services:    Active Member of Clubs or Organizations:    Attends Music therapist:    Marital Status:    Family History  Problem Relation Age of Onset   Diabetes Maternal Aunt    Allergies  Allergen Reactions   Other     "broke out with cleaner they used on her neck"   Prior to Admission medications   Medication Sig Start Date End Date Taking? Authorizing Provider  aspirin 81 MG chewable tablet Chew 81 mg by mouth daily as needed for moderate pain.    [provider]  levothyroxine (SYNTHROID) 112 MCG tablet Take 1 tablet (112 mcg total) by mouth daily. 04/25/19   Philemon Kingdom, MD  Multiple Vitamin (MULTIVITAMIN WITH MINERALS) TABS Take 1 tablet by mouth daily.    [provider]     Positive ROS: Otherwise negative  All other systems have been reviewed and were otherwise negative with the exception of those mentioned in the HPI and as above.  Physical Exam: There were no vitals filed for this visit.  General: Alert, no acute distress Oral: Normal oral mucosa and tonsils Nasal: Clear nasal passages Neck:  No palpable adenopathy or thyroid nodules.  No palpable adenopathy in the neck on the left side.  Patient has an approximate 1 and half centimeter parotid mass just in front of the tragus by about 2 cm. Ear: Ear canal is clear with normal appearing TMs Cardiovascular: Regular rate and rhythm, no murmur.  Respiratory: Clear to auscultation Neurologic: Alert and oriented x 3   Assessment/Plan: Left parotid neoplasm  Plan for left superficial parotidectomy with facial nerve dissection.   [name], MD 12/12/2019 6:26 PM

## 2019-12-12 NOTE — H&P (View-Only) (Signed)
PREOPERATIVE H&P  Chief Complaint: Left parotid mass  HPI: Sharon Moreno is a 49 y.o. female who presents for evaluation of left parotid mass that was initially evaluated 6 to 8 months ago with CT scan that demonstrated a 1 to 1/2 cm left anterior superficial parotid mass.  Fine-needle aspirate was performed and demonstrated malignant cells.  She is taken to the operating room at this time for left parotidectomy and facial nerve dissection.  No neck adenopathy was noted on the CT scan.  She has not noted any significant enlargement of the mass over the last 6 months and she has normal facial nerve function.  Past Medical History:  Diagnosis Date  . Hypothyroidism   . Thyroid disease    Past Surgical History:  Procedure Laterality Date  . CESAREAN SECTION    . THYROIDECTOMY    . TONSILLECTOMY     Social History   Socioeconomic History  . Marital status: Single    Spouse name: Not on file  . Number of children: Not on file  . Years of education: Not on file  . Highest education level: Not on file  Occupational History  . Not on file  Tobacco Use  . Smoking status: Never Smoker  . Smokeless tobacco: Never Used  Vaping Use  . Vaping Use: Never used  Substance and Sexual Activity  . Alcohol use: No  . Drug use: No  . Sexual activity: Not Currently    Birth control/protection: Post-menopausal  Other Topics Concern  . Not on file  Social History Narrative  . Not on file   Social Determinants of Health   Financial Resource Strain:   . Difficulty of Paying Living Expenses:   Food Insecurity:   . Worried About Charity fundraiser in the Last Year:   . Arboriculturist in the Last Year:   Transportation Needs:   . Film/video editor (Medical):   Marland Kitchen Lack of Transportation (Non-Medical):   Physical Activity:   . Days of Exercise per Week:   . Minutes of Exercise per Session:   Stress:   . Feeling of Stress :   Social Connections:   . Frequency of Communication with  Friends and Family:   . Frequency of Social Gatherings with Friends and Family:   . Attends Religious Services:   . Active Member of Clubs or Organizations:   . Attends Archivist Meetings:   Marland Kitchen Marital Status:    Family History  Problem Relation Age of Onset  . Diabetes Maternal Aunt    Allergies  Allergen Reactions  . Other     "broke out with cleaner they used on her neck"   Prior to Admission medications   Medication Sig Start Date End Date Taking? Authorizing Provider  aspirin 81 MG chewable tablet Chew 81 mg by mouth daily as needed for moderate pain.    [provider]  levothyroxine (SYNTHROID) 112 MCG tablet Take 1 tablet (112 mcg total) by mouth daily. 04/25/19   Philemon Kingdom, MD  Multiple Vitamin (MULTIVITAMIN WITH MINERALS) TABS Take 1 tablet by mouth daily.    [provider]     Positive ROS: Otherwise negative  All other systems have been reviewed and were otherwise negative with the exception of those mentioned in the HPI and as above.  Physical Exam: There were no vitals filed for this visit.  General: Alert, no acute distress Oral: Normal oral mucosa and tonsils Nasal: Clear nasal passages Neck:  No palpable adenopathy or thyroid nodules.  No palpable adenopathy in the neck on the left side.  Patient has an approximate 1 and half centimeter parotid mass just in front of the tragus by about 2 cm. Ear: Ear canal is clear with normal appearing TMs Cardiovascular: Regular rate and rhythm, no murmur.  Respiratory: Clear to auscultation Neurologic: Alert and oriented x 3   Assessment/Plan: Left parotid neoplasm  Plan for left superficial parotidectomy with facial nerve dissection.   [name], MD 12/12/2019 6:26 PM

## 2019-12-16 ENCOUNTER — Other Ambulatory Visit (HOSPITAL_COMMUNITY)
Admission: RE | Admit: 2019-12-16 | Discharge: 2019-12-16 | Disposition: A | Discharge status: Home / Self Care | Payer: 59 | Source: Ambulatory Visit | Attending: Otolaryngology | Admitting: Otolaryngology

## 2019-12-16 DIAGNOSIS — Z01812 Encounter for preprocedural laboratory examination: Secondary | ICD-10-CM | POA: Insufficient documentation

## 2019-12-16 DIAGNOSIS — Z20822 Contact with and (suspected) exposure to covid-19: Secondary | ICD-10-CM | POA: Insufficient documentation

## 2019-12-16 LAB — SARS CORONAVIRUS 2 (TAT 6-24 HRS): SARS Coronavirus 2: NEGATIVE

## 2019-12-19 ENCOUNTER — Inpatient Hospital Stay (HOSPITAL_BASED_OUTPATIENT_CLINIC_OR_DEPARTMENT_OTHER)
Admission: AD | Admit: 2019-12-19 | Discharge: 2019-12-20 | DRG: 145 | Disposition: A | Discharge status: Home / Self Care | Payer: 59 | Attending: Otolaryngology | Admitting: Otolaryngology

## 2019-12-19 ENCOUNTER — Other Ambulatory Visit: Payer: Self-pay

## 2019-12-19 ENCOUNTER — Ambulatory Visit (HOSPITAL_BASED_OUTPATIENT_CLINIC_OR_DEPARTMENT_OTHER): Payer: 59 | Admitting: Certified Registered"

## 2019-12-19 ENCOUNTER — Encounter (HOSPITAL_BASED_OUTPATIENT_CLINIC_OR_DEPARTMENT_OTHER): Payer: Self-pay | Admitting: Otolaryngology

## 2019-12-19 ENCOUNTER — Encounter (HOSPITAL_BASED_OUTPATIENT_CLINIC_OR_DEPARTMENT_OTHER)
Admission: AD | Disposition: A | Discharge status: Home / Self Care | Payer: Self-pay | Source: Home / Self Care | Attending: Otolaryngology

## 2019-12-19 DIAGNOSIS — Z79890 Hormone replacement therapy (postmenopausal): Secondary | ICD-10-CM

## 2019-12-19 DIAGNOSIS — Z6833 Body mass index (BMI) 33.0-33.9, adult: Secondary | ICD-10-CM

## 2019-12-19 DIAGNOSIS — Z888 Allergy status to other drugs, medicaments and biological substances status: Secondary | ICD-10-CM

## 2019-12-19 DIAGNOSIS — R221 Localized swelling, mass and lump, neck: Secondary | ICD-10-CM | POA: Diagnosis present

## 2019-12-19 DIAGNOSIS — Z79899 Other long term (current) drug therapy: Secondary | ICD-10-CM | POA: Diagnosis not present

## 2019-12-19 DIAGNOSIS — E890 Postprocedural hypothyroidism: Secondary | ICD-10-CM | POA: Diagnosis present

## 2019-12-19 DIAGNOSIS — E669 Obesity, unspecified: Secondary | ICD-10-CM | POA: Diagnosis present

## 2019-12-19 DIAGNOSIS — D37030 Neoplasm of uncertain behavior of the parotid salivary glands: Secondary | ICD-10-CM | POA: Diagnosis not present

## 2019-12-19 DIAGNOSIS — C07 Malignant neoplasm of parotid gland: Principal | ICD-10-CM | POA: Diagnosis present

## 2019-12-19 DIAGNOSIS — D490 Neoplasm of unspecified behavior of digestive system: Secondary | ICD-10-CM | POA: Diagnosis present

## 2019-12-19 DIAGNOSIS — Z20822 Contact with and (suspected) exposure to covid-19: Secondary | ICD-10-CM | POA: Diagnosis present

## 2019-12-19 DIAGNOSIS — Z7982 Long term (current) use of aspirin: Secondary | ICD-10-CM | POA: Diagnosis not present

## 2019-12-19 HISTORY — PX: RADICAL NECK DISSECTION: SHX2284

## 2019-12-19 HISTORY — DX: Hypothyroidism, unspecified: E03.9

## 2019-12-19 HISTORY — PX: PAROTIDECTOMY: SHX2163

## 2019-12-19 SURGERY — EXCISION, PAROTID GLAND
Anesthesia: General | Site: Face | Laterality: Left

## 2019-12-19 MED ORDER — PROMETHAZINE HCL 25 MG/ML IJ SOLN
6.2500 mg | INTRAMUSCULAR | Status: DC | PRN
  Administered 2019-12-19: 6.25 mg via INTRAVENOUS

## 2019-12-19 MED ORDER — DEXAMETHASONE SODIUM PHOSPHATE 10 MG/ML IJ SOLN
INTRAMUSCULAR | Status: AC
  Filled 2019-12-19: qty 3

## 2019-12-19 MED ORDER — OXYMETAZOLINE HCL 0.05 % NA SOLN
NASAL | Status: DC | PRN
  Administered 2019-12-19: 1

## 2019-12-19 MED ORDER — CEFAZOLIN SODIUM-DEXTROSE 1-4 GM/50ML-% IV SOLN
1.0000 g | Freq: Three times a day (TID) | INTRAVENOUS | Status: DC
  Administered 2019-12-19 – 2019-12-20 (×3): 1 g via INTRAVENOUS
  Filled 2019-12-19 (×3): qty 50

## 2019-12-19 MED ORDER — LIDOCAINE 2% (20 MG/ML) 5 ML SYRINGE
INTRAMUSCULAR | Status: DC | PRN
  Administered 2019-12-19: 80 mg via INTRAVENOUS

## 2019-12-19 MED ORDER — ADULT MULTIVITAMIN W/MINERALS CH
1.0000 | ORAL_TABLET | Freq: Every day | ORAL | Status: DC
  Administered 2019-12-19: 1 via ORAL
  Filled 2019-12-19: qty 1

## 2019-12-19 MED ORDER — HYDROMORPHONE HCL 1 MG/ML IJ SOLN: 0.2500 mg | INTRAMUSCULAR | Status: DC | PRN

## 2019-12-19 MED ORDER — LIDOCAINE-EPINEPHRINE 1 %-1:100000 IJ SOLN
INTRAMUSCULAR | Status: DC | PRN
  Administered 2019-12-19: 7 mL

## 2019-12-19 MED ORDER — LACTATED RINGERS IV SOLN: INTRAVENOUS | Status: DC

## 2019-12-19 MED ORDER — PROMETHAZINE HCL 25 MG/ML IJ SOLN
INTRAMUSCULAR | Status: AC
  Filled 2019-12-19: qty 1

## 2019-12-19 MED ORDER — MIDAZOLAM HCL 2 MG/2ML IJ SOLN
INTRAMUSCULAR | Status: AC
  Filled 2019-12-19: qty 2

## 2019-12-19 MED ORDER — HYDROCODONE-ACETAMINOPHEN 5-325 MG PO TABS: 1.0000 | ORAL_TABLET | ORAL | Status: DC | PRN

## 2019-12-19 MED ORDER — EPHEDRINE 5 MG/ML INJ
INTRAVENOUS | Status: AC
  Filled 2019-12-19: qty 10

## 2019-12-19 MED ORDER — BACITRACIN ZINC 500 UNIT/GM EX OINT
1.0000 "application " | TOPICAL_OINTMENT | Freq: Three times a day (TID) | CUTANEOUS | Status: DC
  Filled 2019-12-19: qty 28.35

## 2019-12-19 MED ORDER — OXYCODONE HCL 5 MG PO TABS: 5.0000 mg | ORAL_TABLET | Freq: Once | ORAL | Status: DC | PRN

## 2019-12-19 MED ORDER — AMISULPRIDE (ANTIEMETIC) 5 MG/2ML IV SOLN: 10.0000 mg | Freq: Once | INTRAVENOUS | Status: DC | PRN

## 2019-12-19 MED ORDER — LIDOCAINE 2% (20 MG/ML) 5 ML SYRINGE
INTRAMUSCULAR | Status: AC
  Filled 2019-12-19: qty 10

## 2019-12-19 MED ORDER — SUCCINYLCHOLINE CHLORIDE 20 MG/ML IJ SOLN
INTRAMUSCULAR | Status: DC | PRN
  Administered 2019-12-19: 120 mg via INTRAVENOUS

## 2019-12-19 MED ORDER — CEFAZOLIN SODIUM-DEXTROSE 2-4 GM/100ML-% IV SOLN
INTRAVENOUS | Status: AC
  Filled 2019-12-19: qty 100

## 2019-12-19 MED ORDER — EPHEDRINE SULFATE 50 MG/ML IJ SOLN
INTRAMUSCULAR | Status: DC | PRN
  Administered 2019-12-19: 10 mg via INTRAVENOUS
  Administered 2019-12-19 (×2): 5 mg via INTRAVENOUS

## 2019-12-19 MED ORDER — BUPIVACAINE HCL (PF) 0.5 % IJ SOLN
INTRAMUSCULAR | Status: AC
  Filled 2019-12-19: qty 30

## 2019-12-19 MED ORDER — MUPIROCIN 2 % EX OINT
TOPICAL_OINTMENT | CUTANEOUS | Status: AC
  Filled 2019-12-19: qty 22

## 2019-12-19 MED ORDER — CHLORHEXIDINE GLUCONATE CLOTH 2 % EX PADS: 6.0000 | MEDICATED_PAD | Freq: Once | CUTANEOUS | Status: DC

## 2019-12-19 MED ORDER — FENTANYL CITRATE (PF) 100 MCG/2ML IJ SOLN
INTRAMUSCULAR | Status: DC | PRN
  Administered 2019-12-19: 25 ug via INTRAVENOUS
  Administered 2019-12-19: 100 ug via INTRAVENOUS
  Administered 2019-12-19: 50 ug via INTRAVENOUS
  Administered 2019-12-19: 25 ug via INTRAVENOUS
  Administered 2019-12-19: 50 ug via INTRAVENOUS

## 2019-12-19 MED ORDER — BACITRACIN ZINC 500 UNIT/GM EX OINT
TOPICAL_OINTMENT | CUTANEOUS | Status: AC
  Filled 2019-12-19: qty 28.35

## 2019-12-19 MED ORDER — DEXAMETHASONE SODIUM PHOSPHATE 10 MG/ML IJ SOLN
INTRAMUSCULAR | Status: DC | PRN
  Administered 2019-12-19: 10 mg via INTRAVENOUS

## 2019-12-19 MED ORDER — IBUPROFEN 100 MG/5ML PO SUSP
400.0000 mg | Freq: Four times a day (QID) | ORAL | Status: DC | PRN
  Administered 2019-12-19: 400 mg via ORAL
  Filled 2019-12-19: qty 20

## 2019-12-19 MED ORDER — FENTANYL CITRATE (PF) 100 MCG/2ML IJ SOLN
INTRAMUSCULAR | Status: AC
  Filled 2019-12-19: qty 2

## 2019-12-19 MED ORDER — OXYTOCIN 10 UNIT/ML IJ SOLN
INTRAMUSCULAR | Status: AC
  Filled 2019-12-19: qty 1

## 2019-12-19 MED ORDER — PROPOFOL 10 MG/ML IV BOLUS
INTRAVENOUS | Status: AC
  Filled 2019-12-19: qty 20

## 2019-12-19 MED ORDER — MORPHINE SULFATE (PF) 4 MG/ML IV SOLN: 2.0000 mg | INTRAVENOUS | Status: DC | PRN

## 2019-12-19 MED ORDER — MIDAZOLAM HCL 5 MG/5ML IJ SOLN
INTRAMUSCULAR | Status: DC | PRN
  Administered 2019-12-19: 2 mg via INTRAVENOUS

## 2019-12-19 MED ORDER — OXYCODONE HCL 5 MG/5ML PO SOLN: 5.0000 mg | Freq: Once | ORAL | Status: DC | PRN

## 2019-12-19 MED ORDER — SUCCINYLCHOLINE CHLORIDE 200 MG/10ML IV SOSY
PREFILLED_SYRINGE | INTRAVENOUS | Status: AC
  Filled 2019-12-19: qty 10

## 2019-12-19 MED ORDER — MISOPROSTOL 200 MCG PO TABS
ORAL_TABLET | ORAL | Status: AC
  Filled 2019-12-19: qty 1

## 2019-12-19 MED ORDER — LIDOCAINE-EPINEPHRINE 1 %-1:100000 IJ SOLN
INTRAMUSCULAR | Status: AC
  Filled 2019-12-19: qty 1

## 2019-12-19 MED ORDER — CEFAZOLIN SODIUM-DEXTROSE 2-4 GM/100ML-% IV SOLN
2.0000 g | INTRAVENOUS | Status: AC
  Administered 2019-12-19: 2 g via INTRAVENOUS

## 2019-12-19 MED ORDER — ONDANSETRON HCL 4 MG/2ML IJ SOLN
INTRAMUSCULAR | Status: AC
  Filled 2019-12-19: qty 6

## 2019-12-19 MED ORDER — ONDANSETRON HCL 4 MG/2ML IJ SOLN
INTRAMUSCULAR | Status: DC | PRN
  Administered 2019-12-19: 4 mg via INTRAVENOUS

## 2019-12-19 MED ORDER — MUPIROCIN 2 % EX OINT
TOPICAL_OINTMENT | CUTANEOUS | Status: DC | PRN
  Administered 2019-12-19: 1 via TOPICAL

## 2019-12-19 MED ORDER — PROPOFOL 500 MG/50ML IV EMUL
INTRAVENOUS | Status: AC
  Filled 2019-12-19: qty 50

## 2019-12-19 MED ORDER — MEPERIDINE HCL 25 MG/ML IJ SOLN: 6.2500 mg | INTRAMUSCULAR | Status: DC | PRN

## 2019-12-19 MED ORDER — LEVOTHYROXINE SODIUM 112 MCG PO TABS
112.0000 ug | ORAL_TABLET | Freq: Every day | ORAL | Status: DC
  Administered 2019-12-19: 112 ug via ORAL
  Filled 2019-12-19: qty 1

## 2019-12-19 MED ORDER — PROPOFOL 10 MG/ML IV BOLUS
INTRAVENOUS | Status: DC | PRN
  Administered 2019-12-19: 150 mg via INTRAVENOUS

## 2019-12-19 SURGICAL SUPPLY — 83 items
ADH SKN CLS APL DERMABOND .7 (GAUZE/BANDAGES/DRESSINGS)
APL SKNCLS STERI-STRIP NONHPOA (GAUZE/BANDAGES/DRESSINGS)
APL SWBSTK 6 STRL LF DISP (MISCELLANEOUS) ×2
APPLICATOR COTTON TIP 6 STRL (MISCELLANEOUS) ×2 IMPLANT
APPLICATOR COTTON TIP 6IN STRL (MISCELLANEOUS) ×4
ATTRACTOMAT 16X20 MAGNETIC DRP (DRAPES) ×4 IMPLANT
BALL CTTN LRG ABS STRL LF (GAUZE/BANDAGES/DRESSINGS) ×2
BENZOIN TINCTURE PRP APPL 2/3 (GAUZE/BANDAGES/DRESSINGS) IMPLANT
BLADE SURG 12 STRL SS (BLADE) ×4 IMPLANT
BLADE SURG 15 STRL LF DISP TIS (BLADE) ×2 IMPLANT
BLADE SURG 15 STRL SS (BLADE) ×4
CANISTER SUCT 1200ML W/VALVE (MISCELLANEOUS) ×4 IMPLANT
CLEANER CAUTERY TIP 5X5 PAD (MISCELLANEOUS) IMPLANT
CLOSURE WOUND 1/2 X4 (GAUZE/BANDAGES/DRESSINGS)
CORD BIPOLAR FORCEPS 12FT (ELECTRODE) ×4 IMPLANT
COTTONBALL LRG STERILE PKG (GAUZE/BANDAGES/DRESSINGS) ×4 IMPLANT
COVER BACK TABLE 60X90IN (DRAPES) ×4 IMPLANT
COVER MAYO STAND STRL (DRAPES) ×4 IMPLANT
COVER WAND RF STERILE (DRAPES) IMPLANT
DECANTER SPIKE VIAL GLASS SM (MISCELLANEOUS) ×4 IMPLANT
DERMABOND ADVANCED (GAUZE/BANDAGES/DRESSINGS)
DERMABOND ADVANCED .7 DNX12 (GAUZE/BANDAGES/DRESSINGS) IMPLANT
DRAIN JP 10F RND SILICONE (MISCELLANEOUS) ×3 IMPLANT
DRAIN PENROSE 1/4X12 LTX STRL (WOUND CARE) IMPLANT
DRAPE SURG 17X23 STRL (DRAPES) ×4 IMPLANT
DRAPE U-SHAPE 76X120 STRL (DRAPES) ×4 IMPLANT
ELECT COATED BLADE 2.86 ST (ELECTRODE) ×4 IMPLANT
ELECT REM PT RETURN 9FT ADLT (ELECTROSURGICAL) ×4
ELECTRODE REM PT RTRN 9FT ADLT (ELECTROSURGICAL) ×2 IMPLANT
EVACUATOR SILICONE 100CC (DRAIN) ×3 IMPLANT
FORCEPS BIPOLAR SPETZLER 8 1.0 (NEUROSURGERY SUPPLIES) ×4 IMPLANT
GAUZE 4X4 16PLY RFD (DISPOSABLE) ×3 IMPLANT
GAUZE SPONGE 4X4 12PLY STRL LF (GAUZE/BANDAGES/DRESSINGS) ×9 IMPLANT
GLOVE BIOGEL M 6.5 STRL (GLOVE) ×6 IMPLANT
GLOVE BIOGEL PI IND STRL 6.5 (GLOVE) ×3 IMPLANT
GLOVE BIOGEL PI IND STRL 7.0 (GLOVE) ×2 IMPLANT
GLOVE BIOGEL PI INDICATOR 6.5 (GLOVE) ×6
GLOVE BIOGEL PI INDICATOR 7.0 (GLOVE) ×4
GLOVE ECLIPSE 6.5 STRL STRAW (GLOVE) ×6 IMPLANT
GLOVE SS BIOGEL STRL SZ 7.5 (GLOVE) ×2 IMPLANT
GLOVE SUPERSENSE BIOGEL SZ 7.5 (GLOVE) ×2
GLOVE SURG SS PI 7.5 STRL IVOR (GLOVE) IMPLANT
GOWN STRL REUS W/ TWL LRG LVL3 (GOWN DISPOSABLE) ×6 IMPLANT
GOWN STRL REUS W/ TWL XL LVL3 (GOWN DISPOSABLE) ×2 IMPLANT
GOWN STRL REUS W/TWL LRG LVL3 (GOWN DISPOSABLE) ×20
GOWN STRL REUS W/TWL XL LVL3 (GOWN DISPOSABLE) ×4
LOCATOR NERVE 3 VOLT (DISPOSABLE) ×3 IMPLANT
NDL HYPO 25X1 1.5 SAFETY (NEEDLE) ×1 IMPLANT
NEEDLE HYPO 25X1 1.5 SAFETY (NEEDLE) ×4 IMPLANT
NS IRRIG 1000ML POUR BTL (IV SOLUTION) ×4 IMPLANT
PAD CLEANER CAUTERY TIP 5X5 (MISCELLANEOUS)
PENCIL SMOKE EVACUATOR (MISCELLANEOUS) ×4 IMPLANT
PIN SAFETY STERILE (MISCELLANEOUS) IMPLANT
SET BASIN DAY SURGERY F.S. (CUSTOM PROCEDURE TRAY) ×4 IMPLANT
SHEET MEDIUM DRAPE 40X70 STRL (DRAPES) IMPLANT
SLEEVE SCD COMPRESS KNEE MED (MISCELLANEOUS) ×4 IMPLANT
SPONGE INTESTINAL PEANUT (DISPOSABLE) ×3 IMPLANT
STAPLER VISISTAT 35W (STAPLE) IMPLANT
STRIP CLOSURE SKIN 1/2X4 (GAUZE/BANDAGES/DRESSINGS) IMPLANT
SUCTION FRAZIER HANDLE 10FR (MISCELLANEOUS) ×4
SUCTION TUBE FRAZIER 10FR DISP (MISCELLANEOUS) ×1 IMPLANT
SUT CHROMIC 3 0 PS 2 (SUTURE) ×4 IMPLANT
SUT ETHILON 3 0 PS 1 (SUTURE) ×4 IMPLANT
SUT ETHILON 4 0 PS 2 18 (SUTURE) IMPLANT
SUT ETHILON 5 0 P 3 18 (SUTURE) ×8
SUT ETHILON 6 0 P 1 (SUTURE) IMPLANT
SUT NYLON ETHILON 5-0 P-3 1X18 (SUTURE) ×2 IMPLANT
SUT SILK 2 0 PERMA HAND 18 BK (SUTURE) ×4 IMPLANT
SUT SILK 2 0 TIES 17X18 (SUTURE)
SUT SILK 2-0 18XBRD TIE BLK (SUTURE) IMPLANT
SUT SILK 3 0 PS 1 (SUTURE) IMPLANT
SUT SILK 3 0 SH 30 (SUTURE) ×3 IMPLANT
SUT SILK 3 0 TIES 17X18 (SUTURE)
SUT SILK 3-0 18XBRD TIE BLK (SUTURE) ×1 IMPLANT
SUT SILK 4 0 TIES 17X18 (SUTURE) ×4 IMPLANT
SWAB COLLECTION DEVICE MRSA (MISCELLANEOUS) IMPLANT
SWAB CULTURE ESWAB REG 1ML (MISCELLANEOUS) IMPLANT
SYR BULB EAR ULCER 3OZ GRN STR (SYRINGE) ×4 IMPLANT
SYR CONTROL 10ML LL (SYRINGE) ×4 IMPLANT
TOWEL GREEN STERILE FF (TOWEL DISPOSABLE) ×8 IMPLANT
TRAY DSU PREP LF (CUSTOM PROCEDURE TRAY) ×4 IMPLANT
TUBE CONNECTING 20'X1/4 (TUBING) ×1
TUBE CONNECTING 20X1/4 (TUBING) ×3 IMPLANT

## 2019-12-19 NOTE — Anesthesia Postprocedure Evaluation (Signed)
Anesthesia Post Note  Patient: Sharon Moreno  Procedure(s) Performed: LEFT PAROTIDECTOMY (Left Face) LEFT FACIAL DISSECTION (Left Face)     Patient location during evaluation: PACU Anesthesia Type: General Level of consciousness: awake and alert, oriented and patient cooperative Pain management: pain level controlled Vital Signs Assessment: post-procedure vital signs reviewed and stable Respiratory status: spontaneous breathing, nonlabored ventilation and respiratory function stable Cardiovascular status: blood pressure returned to baseline and stable Postop Assessment: no apparent nausea or vomiting Anesthetic complications: no   No complications documented.  Last Vitals:  Vitals:   12/19/19 1300 12/19/19 1315  BP: 119/73 125/75  Pulse: 83 83  Resp: (!) 22 16  Temp:  (!) 36.2 C  SpO2: 98% 98%    Last Pain:  Vitals:   12/19/19 1315  TempSrc:   PainSc: 0-No pain                 Deklen Popelka,E. Sherrelle Prochazka

## 2019-12-19 NOTE — Anesthesia Preprocedure Evaluation (Signed)
Anesthesia Evaluation  Patient identified by MRN, date of birth, ID band Patient awake    Reviewed: Allergy & Precautions, NPO status , Patient's Chart, lab work & pertinent test results  Airway Mallampati: II  TM Distance: >3 FB Neck ROM: Full    Dental no notable dental hx.    Pulmonary neg pulmonary ROS,    Pulmonary exam normal breath sounds clear to auscultation       Cardiovascular negative cardio ROS Normal cardiovascular exam Rhythm:Regular Rate:Normal     Neuro/Psych negative neurological ROS  negative psych ROS   GI/Hepatic negative GI ROS, Neg liver ROS,   Endo/Other  Hypothyroidism   Renal/GU negative Renal ROS  negative genitourinary   Musculoskeletal negative musculoskeletal ROS (+)   Abdominal (+) + obese,   Peds negative pediatric ROS (+)  Hematology negative hematology ROS (+)   Anesthesia Other Findings Parotid gland neoplasm  Reproductive/Obstetrics negative OB ROS                             Anesthesia Physical Anesthesia Plan  ASA: III  Anesthesia Plan: General   Post-op Pain Management:    Induction: Intravenous  PONV Risk Score and Plan: 3 and Ondansetron, Dexamethasone, Midazolam and Treatment may vary due to age or medical condition  Airway Management Planned: Oral ETT  Additional Equipment:   Intra-op Plan:   Post-operative Plan: Extubation in OR  Informed Consent: I have reviewed the patients History and Physical, chart, labs and discussed the procedure including the risks, benefits and alternatives for the proposed anesthesia with the patient or authorized representative who has indicated his/her understanding and acceptance.     Dental advisory given  Plan Discussed with: CRNA  Anesthesia Plan Comments:         Anesthesia Quick Evaluation

## 2019-12-19 NOTE — OR Nursing (Signed)
Pathology call received in OR via speaker phone with results of frozen speciman given to Dr. Lucia Gaskins.

## 2019-12-19 NOTE — Transfer of Care (Signed)
Immediate Anesthesia Transfer of Care Note  Patient: Sharon Moreno  Procedure(s) Performed: LEFT PAROTIDECTOMY (Left Face) LEFT FACIAL DISSECTION (Left Face)  Patient Location: PACU  Anesthesia Type:General  Level of Consciousness: awake, alert  and oriented  Airway & Oxygen Therapy: Patient Spontanous Breathing and Patient connected to face mask oxygen  Post-op Assessment: Report given to RN and Post -op Vital signs reviewed and stable  Post vital signs: Reviewed and stable  Last Vitals:  Vitals Value Taken Time  BP 131/82 12/19/19 1156  Temp    Pulse 92 12/19/19 1158  Resp 21 12/19/19 1158  SpO2 100 % 12/19/19 1158  Vitals shown include unvalidated device data.  Last Pain:  Vitals:   12/19/19 0628  TempSrc: Oral  PainSc: 5       Patients Stated Pain Goal: 6 (78/97/84 7841)  Complications: No complications documented.

## 2019-12-19 NOTE — Interval H&P Note (Signed)
History and Physical Interval Note:  12/19/2019 7:33 AM  Sharon Moreno  has presented today for surgery, with the diagnosis of NEOPLASMA OF UNCERTAIN BEHAVIOR PAROTID GLAND.  The various methods of treatment have been discussed with the patient and family. After consideration of risks, benefits and other options for treatment, the patient has consented to  Procedure(s): LEFT PAROTIDECTOMY (Left) LEFT FACIAL DISSECTION (Left) as a surgical intervention.  [name] history has been reviewed, patient examined, no change in status, stable for surgery.  I have reviewed the patient's chart and labs.  Questions were answered to the patient's satisfaction.     [name]

## 2019-12-19 NOTE — Brief Op Note (Signed)
12/19/2019  11:40 AM  PATIENT:  Sharon Moreno  49 y.o. female  PRE-OPERATIVE DIAGNOSIS:  NEOPLASMA OF UNCERTAIN BEHAVIOR PAROTID GLAND  POST-OPERATIVE DIAGNOSIS:  NEOPLASMA OF UNCERTAIN BEHAVIOR PAROTID GLAND  PROCEDURE:  Procedure(s): LEFT PAROTIDECTOMY (Left) LEFT FACIAL DISSECTION (Left)  SURGEON:  Surgeon(s) and Role:    Rozetta Nunnery, MD - Primary  PHYSICIAN ASSISTANT:   ASSISTANTS: none   ANESTHESIA:   general  EBL:  50 mL   BLOOD ADMINISTERED:none  DRAINS: (left parotid bed) Jackson-Pratt drain(s) with closed bulb suction in the left parotid bed   LOCAL MEDICATIONS USED:  XYLOCAINE   SPECIMEN:  Source of Specimen:  left parotid gland  DISPOSITION OF SPECIMEN:  PATHOLOGY  COUNTS:  YES  TOURNIQUET:  * No tourniquets in log *  DICTATION: .Other Dictation: Dictation Number [id number]  PLAN OF CARE: Admit for overnight observation  PATIENT DISPOSITION:  PACU - hemodynamically stable.   Delay start of Pharmacological VTE agent (>24hrs) due to surgical blood loss or risk of bleeding: yes

## 2019-12-19 NOTE — Progress Notes (Signed)
Post Op check Doing well without complaints. Good facial nerve function Minimal serosanguinous drainage in bulb. Plan to remove JP drain in am and d/c in am.

## 2019-12-19 NOTE — Op Note (Signed)
NAME: Sharon Moreno, VANMETRE MEDICAL RECORD [id number] ACCOUNT [id number] DATE OF BIRTH:17-Sep-1970 FACILITY: [facility] LOCATION: [facility] PHYSICIAN:Aqib Lough Lincoln Maxin, MD  OPERATIVE REPORT  DATE OF PROCEDURE:  12/19/2019  PREOPERATIVE DIAGNOSIS:  Left parotid mass with fine needle aspirate consistent with carcinoma.  POSTOPERATIVE DIAGNOSIS:  Left parotid mass with fine needle aspirate consistent with carcinoma.  OPERATION PERFORMED:  Left superficial parotidectomy with facial nerve dissection.  SURGEON:  [name], MD  ANESTHESIA:  General endotracheal.  COMPLICATIONS:  None.  BRIEF CLINICAL NOTE:  The patient is a 49 year old female who has noticed a nodule in the left preauricular area now for over 8 months.  She had a CT scan performed about 6 months ago that demonstrated a 1.5 cm mass within the parotid gland anteriorly,  superiorly.  Fine needle aspirate of this, which was performed about a month ago was suspicious for carcinoma.  This mass has not changed significantly in the past 6 months.  She has no other palpable adenopathy on clinical exam and no adenopathy noted  on CT scan.  She is taken to the operating room at this time for superficial parotidectomy and facial nerve dissection to remove the parotid neoplasm.  Of note, this is located anterior superiorly, which will involve the distal branches of the nerves to  the eye.  However, she has normal facial nerve function on clinical exam.  DESCRIPTION OF PROCEDURE:  After adequate endotracheal anesthesia, the patient received 2 grams Ancef IV preoperatively.  Left parotid and neck area was prepped with Betadine solution and draped in sterile towels.  The facial nerve monitor was used.  A  standard parotid incision was made and superficial flaps were elevated just superficial to the parotid fascia anteriorly.  This mass was located anterior superiorly at the distal edge of the parotid fascia.  Next, dissection was carried  down the  pretragal area to find the facial nerve.  The facial nerve trunk was identified in standard anatomical position.  This was then dissected out to the branches where it split into the major inferior and major superior branch.  This mass was involving just  the superior portion of the parotid gland and initial dissection was carried out to resect the superior lobe of the parotid gland.  This was fairly meticulous dissecting out the distal branches of the facial nerve superiorly.  All facial nerve branches  that could be visualized were saved.  In addition, during dissection superiorly, there was a single lymph node that was removed and sent for frozen section and this revealed a benign lymph node.  The parotid mass, which could be palpated was meticulously  dissected off of the facial nerve branches anterior superiorly.  Hemostasis was obtained with bipolar cautery.  After removing the superior superficial lobe of the parotid gland this was sent as superior parotid lobe specimen.  This contained the  palpable mass.  Next, the inferior parotid gland that was superficial to the nerve was grossly dissected out and sent as inferior lobe of the parotid gland.  There was 1 small inferior lymph node that was sent and then in addition right before closing  there was 1 small superior posterior lymph node that was sent as a separate specimen for permanent section.  Both these lymph nodes appeared benign grossly.  Hemostasis was obtained with the bipolar cautery.  Wound was irrigated with saline.  A  Jackson-Pratt drain was brought out posteriorly behind the ear.  Incision was closed with 3-0 chromic suture subcutaneously and a  5-0 nylon to reapproximate the skin edges.  The patient was awoken from anesthesia and transferred to recovery room  postoperatively doing well.  DISPOSITION:  She will be observed overnight in the recovery care center and plan on discharge in the morning after removing the JP  drain.  CN/NUANCE  D:12/19/2019 T:12/19/2019 JOB:[id number]

## 2019-12-19 NOTE — Anesthesia Procedure Notes (Signed)
Procedure Name: Intubation Date/Time: 12/19/2019 7:47 AM Performed by: Lavonia Dana, CRNA Pre-anesthesia Checklist: Patient identified, Emergency Drugs available, Suction available and Patient being monitored Patient Re-evaluated:Patient Re-evaluated prior to induction Oxygen Delivery Method: Circle system utilized Preoxygenation: Pre-oxygenation with 100% oxygen Induction Type: IV induction Ventilation: Mask ventilation without difficulty Laryngoscope Size: Mac and 3 Grade View: Grade II Tube type: Oral Tube size: 7.0 mm Number of attempts: 1 Airway Equipment and Method: Stylet,  Oral airway and Bite block Placement Confirmation: ETT inserted through vocal cords under direct vision,  positive ETCO2 and breath sounds checked- equal and bilateral Secured at: 20 cm Tube secured with: Tape Dental Injury: Teeth and Oropharynx as per pre-operative assessment  Comments: Grade IIb view

## 2019-12-19 NOTE — Discharge Instructions (Addendum)
Remove dressing tomorrow and apply antibiotic ointment to incision site daily OK to wash incision site in 24 hrs Follow up with Dr Lucia Gaskins next Thursday @ 10 am Tylenol or ibuprofen prn pain   Post Anesthesia Home Care Instructions  Activity: Get plenty of rest for the remainder of the day. A responsible individual must stay with you for 24 hours following the procedure.  For the next 24 hours, DO NOT: -Drive a car -Paediatric nurse -Drink alcoholic beverages -Take any medication unless instructed by your physician -Make any legal decisions or sign important papers.  Meals: Start with liquid foods such as gelatin or soup. Progress to regular foods as tolerated. Avoid greasy, spicy, heavy foods. If nausea and/or vomiting occur, drink only clear liquids until the nausea and/or vomiting subsides. Call your physician if vomiting continues.  Special Instructions/Symptoms: Your throat may feel dry or sore from the anesthesia or the breathing tube placed in your throat during surgery. If this causes discomfort, gargle with warm salt water. The discomfort should disappear within 24 hours.  If you had a scopolamine patch placed behind your ear for the management of post- operative nausea and/or vomiting:  1. The medication in the patch is effective for 72 hours, after which it should be removed.  Wrap patch in a tissue and discard in the trash. Wash hands thoroughly with soap and water. 2. You may remove the patch earlier than 72 hours if you experience unpleasant side effects which may include dry mouth, dizziness or visual disturbances. 3. Avoid touching the patch. Wash your hands with soap and water after contact with the patch.

## 2019-12-20 NOTE — Progress Notes (Signed)
POD 1 AF VSS JP output 10 cc was removed. Wound doing well. Good facial n function Patient discharged on tylenol, ibuprofen and her regular meds.

## 2019-12-22 ENCOUNTER — Encounter (HOSPITAL_BASED_OUTPATIENT_CLINIC_OR_DEPARTMENT_OTHER): Payer: Self-pay | Admitting: Otolaryngology

## 2019-12-22 NOTE — Discharge Summary (Signed)
NAME: Sharon Moreno MEDICAL RECORD [id number] ACCOUNT [id number] DATE OF BIRTH:05-21-1971 FACILITY: [facility] LOCATION: [facility] PHYSICIAN:Ranetta Armacost Lincoln Maxin, MD  DISCHARGE SUMMARY  DATE OF DISCHARGE:  12/20/2019  ADMITTING DIAGNOSIS:  Left parotid neoplasm with fine needle aspirate consistent with carcinoma.  DISCHARGE DIAGNOSIS:  Left parotid neoplasm with fine needle aspirate consistent with carcinoma.  OPERATION DURING THIS HOSPITALIZATION:  Left superficial parotidectomy with facial nerve dissection performed on 12/19/2019.    HOSPITAL COURSE:  The patient underwent a left superficial parotidectomy for a left parotid neoplasm that she has had for several months.  Surgery went well.  The patient was admitted for overnight observation.  She remained afebrile.  Vital signs  stable.  She had good facial nerve function and the Hemovac drain was removed on the 1st postoperative day.  The patient was subsequently discharged home.  DISPOSITION:  The patient is discharged home on Tylenol and ibuprofen p.r.n. pain.  She will follow up in my office in 6 days to have her sutures removed and to review pathology.  FINAL DIAGNOSIS:  Left parotid neoplasm.  CN/NUANCE D:12/22/2019 T:12/22/2019 JOB:[id number]

## 2019-12-23 LAB — SURGICAL PATHOLOGY

## 2019-12-25 ENCOUNTER — Other Ambulatory Visit: Payer: Self-pay

## 2019-12-25 ENCOUNTER — Ambulatory Visit (INDEPENDENT_AMBULATORY_CARE_PROVIDER_SITE_OTHER): Payer: 59 | Admitting: Otolaryngology

## 2019-12-25 ENCOUNTER — Encounter (INDEPENDENT_AMBULATORY_CARE_PROVIDER_SITE_OTHER): Payer: Self-pay | Admitting: Otolaryngology

## 2019-12-25 VITALS — Temp 97.2°F

## 2019-12-25 DIAGNOSIS — Z4889 Encounter for other specified surgical aftercare: Secondary | ICD-10-CM

## 2019-12-25 NOTE — Progress Notes (Signed)
HPI: Sharon Moreno is a 49 y.o. female who presents 6 days s/p left superficial parotidectomy with facial nerve dissection for anterior left parotid tumor.  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  No neural invasion or lymphovascular invasion.  5 lymph nodes were removed were all negative.  I reviewed the pathology with the patient in the office today..   Past Medical History:  Diagnosis Date  . Hypothyroidism   . Thyroid disease    Past Surgical History:  Procedure Laterality Date  . CESAREAN SECTION    . PAROTIDECTOMY Left 12/19/2019   Procedure: LEFT PAROTIDECTOMY;  Surgeon: Rozetta Nunnery, MD;  Location: [place];  Service: ENT;  Laterality: Left;  . RADICAL NECK DISSECTION Left 12/19/2019   Procedure: LEFT FACIAL DISSECTION;  Surgeon: Rozetta Nunnery, MD;  Location: [place];  Service: ENT;  Laterality: Left;  . THYROIDECTOMY    . TONSILLECTOMY     Social History   Socioeconomic History  . Marital status: Single    Spouse name: Not on file  . Number of children: Not on file  . Years of education: Not on file  . Highest education level: Not on file  Occupational History  . Not on file  Tobacco Use  . Smoking status: Never Smoker  . Smokeless tobacco: Never Used  Vaping Use  . Vaping Use: Never used  Substance and Sexual Activity  . Alcohol use: No  . Drug use: No  . Sexual activity: Not Currently    Birth control/protection: Post-menopausal  Other Topics Concern  . Not on file  Social History Narrative  . Not on file   Social Determinants of Health   Financial Resource Strain:   . Difficulty of Paying Living Expenses:   Food Insecurity:   . Worried About Charity fundraiser in the Last Year:   . Arboriculturist in the Last Year:   Transportation Needs:   . Film/video editor (Medical):   Marland Kitchen Lack of Transportation (Non-Medical):   Physical Activity:    . Days of Exercise per Week:   . Minutes of Exercise per Session:   Stress:   . Feeling of Stress :   Social Connections:   . Frequency of Communication with Friends and Family:   . Frequency of Social Gatherings with Friends and Family:   . Attends Religious Services:   . Active Member of Clubs or Organizations:   . Attends Archivist Meetings:   Marland Kitchen Marital Status:    Family History  Problem Relation Age of Onset  . Diabetes Maternal Aunt    Allergies  Allergen Reactions  . Other     "broke out with cleaner they used on her neck"   Prior to Admission medications   Medication Sig Start Date End Date Taking? Authorizing Provider  aspirin 81 MG chewable tablet Chew 81 mg by mouth daily as needed for moderate pain.   Yes [provider]  levothyroxine (SYNTHROID) 112 MCG tablet Take 1 tablet (112 mcg total) by mouth daily. 04/25/19  Yes Philemon Kingdom, MD  Multiple Vitamin (MULTIVITAMIN WITH MINERALS) TABS Take 1 tablet by mouth daily.   Yes [provider]     Physical Exam: Incision site is healing nicely with no signs of infection sutures were removed in the office today.  She has normal facial nerve function.  Does complain of some numbness around the earlobe as expected.  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  Plan: She will follow-up in 6 months for recheck. Reviewed with her concerning using antibiotic ointment for the next 2 days and can start [place] if she has much scar tissue in 1 week.   Radene Journey, MD

## 2020-01-01 ENCOUNTER — Encounter (HOSPITAL_BASED_OUTPATIENT_CLINIC_OR_DEPARTMENT_OTHER): Payer: Self-pay | Admitting: Otolaryngology

## 2020-02-05 ENCOUNTER — Other Ambulatory Visit: Payer: Self-pay | Admitting: Internal Medicine

## 2020-04-23 ENCOUNTER — Ambulatory Visit (INDEPENDENT_AMBULATORY_CARE_PROVIDER_SITE_OTHER): Payer: 59 | Admitting: Internal Medicine

## 2020-04-23 ENCOUNTER — Other Ambulatory Visit: Payer: Self-pay

## 2020-04-23 ENCOUNTER — Encounter: Payer: Self-pay | Admitting: Internal Medicine

## 2020-04-23 VITALS — BP 122/84 | HR 68 | Ht 61.0 in | Wt 173.0 lb

## 2020-04-23 DIAGNOSIS — E890 Postprocedural hypothyroidism: Secondary | ICD-10-CM | POA: Diagnosis not present

## 2020-04-23 DIAGNOSIS — C73 Malignant neoplasm of thyroid gland: Secondary | ICD-10-CM | POA: Diagnosis not present

## 2020-04-23 NOTE — Patient Instructions (Addendum)
Please stop at the lab.  Please continue Levothyroxine 112 mcg daily.  Take the thyroid hormone every day, with water, at least 30 minutes before breakfast, separated by at least 4 hours from: - acid reflux medications - calcium - iron - multivitamins  Please come back for a follow-up appointment in 6 months

## 2020-04-23 NOTE — Progress Notes (Signed)
Patient ID: Sharon Moreno, female   DOB: 1970-08-26, 49 y.o.   MRN: [id number]   This visit occurred during the SARS-CoV-2 public health emergency.  Safety protocols were in place, including screening questions prior to the visit, additional usage of staff PPE, and extensive cleaning of exam room while observing appropriate contact time as indicated for disinfecting solutions.   HPI  Sharon Moreno is a 49 y.o.-year-old female, initially referred by her PCP, Dr. Lujean Amel, returning for follow-up for papillary thyroid cancer and postsurgical hypothyroidism.  Patient was previously seen by Dr. Gilberto Better, in [phone], Dr. Dwyane Dee, last visit 5 years ago.  She also saw Dr. Awilda Metro from 2016-2018.  Our first visit was 6 months ago.  Papillary thyroid carcinoma -Diagnosed in in 1986.  She had 3 RAI treatments.    She also had posttreatment whole-body scans-latest recombinant TSH stimulated whole-body scan was in 2017 and this was negative for metastasis or recurrence.  Reviewed and addended had thyroid cancer history: 1998: Thyroid cancer diagnosed by biopsy, after a thyroid nodule was observed by her mother.   10/1996: Total thyroidectomy: PTC, No further information is available about the pathology 10/1996: RAI tx with 100 mCi 2000: WBS: Negative 05/2001: RAI tx with 100 mCi 03/2002: RAI tx with 150 mCi 05/15/2003: Neck ultrasound: Ultrasound examination demonstrates two nodules in the left side of the neck deep to the anterior musculature where the left lobe of the thyroid gland previously resided. This is just medial to the jugular vein. One of these nodules is 5.5 mm in diameter and the other is just inferior and slightly deeper and measures 5.0 mm. I suspect these represent recurrence of tumor.   No other abnormality. No evidence of nodules seen to the right of the trachea.   IMPRESSION Two nodules to the left of the trachea at the site of the previous left lobe of the thyroid gland.  This is worrisome for tumor recurrence. 2007: WBS: Negative 08/18/2015: rTSH stimulated WBS: Negative  08/14/2016: Neck ultrasound: Parenchymal Echotexture: Surgically absent Isthmus: Surgically absent Right lobe: Surgically absent Left lobe: Surgically absent There is a 0.5 x0.3 x 0.4 cm soft tissue nodule in the thyroidectomy bed just to the left of midline (previously 7 x 3 x 6 mm). IMPRESSION: 1. No interval enlargement of small nonspecific nodule presumably residual/recurrent in the left thyroidectomy bed. 04/25/2019: Neck ultrasound: The thyroid gland is surgically absent. Sonographic interrogation of the right and left resection beds demonstrates no evidence of residual or recurrent thyroid tissue or nodularity. There is no evidence of lymphadenopathy. However, there is a circumscribed anechoic structure with posterior acoustic enhancement in the superior aspect of the left neck measuring 0.6 by 0.7 by 1.2 cm. This has not been previously identified.  In the midline, inferior to the prior thyroid isthmus there is a small hypoechoic solid nodule measuring 0.4 x 0.3 x 0.4 cm. This has slightly decreased in size compared to January of 2017 when the abnormality measured 0.7 x 0.3 x 0.6 cm.  IMPRESSION: 1. Slowly decreasing size of small nodule inferior to the prior thyroid isthmus compared to prior studies dating back to January of 2017. Involution over time suggests a benign process. 2. Nonspecific simple appearing cyst in the superior aspect of the right neck measures 1.2 x 0.6 x 0.7 cm and has not been previously identified. This is of uncertain etiology and significance. While this is likely benign, any new cystic lesion in the neck should be evaluated further to exclude  the possibility of necrotic lymphadenopathy. Recommend contrast-enhanced CT scan of the neck. 3. Surgical changes of total thyroidectomy. 05/22/2019: CT neck: 1. Left anterior superior parotid space 12 x  15 mm enhancing and slightly irregular round soft tissue nodule is most compatible with a small primary parotid neoplasm. Recommend ENT referral. 2. No cystic mass or lymphadenopathy in the neck to correspond to the recent ultrasound. The largest lymph nodes are at the right level 2 station measuring 8-9 mm short axis, and remain normal by CT criteria. 3. Negative thyroid bed. 4. Ectatic distal aortic arch, about 30 mm diameter. Recommend annual imaging followup by Chest CTA or MRA.  Patient was referred to ENT.  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  Also, I sent the results to PCP regarding the distal aortic arch dilation.  Thyroglobulin levels remains detectable while ATA antibodies were negative: Lab Results  Component Value Date   THYROGLB 0.3 (L) 04/22/2019   THYROGLB 0.3 10/01/2013   Lab Results  Component Value Date   THGAB <1 04/22/2019   08/19/2014: Tg 0.5, ATA <0.1 05/05/2015: ATA <1.0  Pt denies: - feeling nodules in neck - hoarseness - dysphagia - choking - SOB with lying down  Postsurgical hypothyroidism:  Pt is on levothyroxine 112 mcg daily, taken: - in am - fasting - at least 30 min from b'fast - no Ca, Fe, PPIs, + occasional MVI at night - not on Biotin  Reviewed TFTs: Lab Results  Component Value Date   TSH 1.33 10/22/2019   TSH 3.04 04/22/2019   TSH 1.99 10/01/2013   TSH 1.21 01/28/2013   FREET4 1.16 10/22/2019   FREET4 0.95 04/22/2019   FREET4 1.01 10/01/2013   FREET4 1.02 01/28/2013  01/10/2019: TSH 0.119 11/05/2018: TSH 0.03 08/07/2018: TSH 6.01   She continues to have hot flashes..  No FH of thyroid disease or cancer. No h/o radiation tx to head or neck other than RAI treatment.  No herbal supplements. No Biotin use. No recent steroids use.   She continues on vitamin D supplements.  Sh has FH of DM.  ROS: Constitutional: no weight gain/no weight loss,  no fatigue, + subjective hyperthermia - improved, no subjective hypothermia Eyes: no blurry vision, no xerophthalmia ENT: no sore throat, + see HPI Cardiovascular: no CP/no SOB/no palpitations/no leg swelling Respiratory: no cough/no SOB/no wheezing Gastrointestinal: no N/no V/no D/no C/no acid reflux Musculoskeletal: no muscle aches/no joint aches Skin: no rashes, no hair loss Neurological: no tremors/no numbness/no tingling/no dizziness  I reviewed pt's medications, allergies, PMH, social hx, family hx, and changes were documented in the history of present illness. Otherwise, unchanged from my initial visit note.  Past Medical History:  Diagnosis Date  . Hypothyroidism   . Thyroid disease     Surgeries: -Please see HPI  Social History   Socioeconomic History  . Marital status: Married    Spouse name: Not on file  . Number of children: 2: 73 and 47 years old in 04/2019  . Years of education: Not on file  . Highest education level: Not on file  Occupational History  .  Caseworker for social services  Social Needs  . Financial resource strain: Not on file  . Food insecurity    Worry: Not on file    Inability: Not on file  . Transportation needs    Medical: Not on file    Non-medical: Not on file  Tobacco Use  .  Smoking status: Never Smoker  . Smokeless tobacco: Never Used  Substance and Sexual Activity  . Alcohol use: No  . Drug use: No   Current Outpatient Medications on File Prior to Visit  Medication Sig Dispense Refill  . aspirin 81 MG chewable tablet Chew 81 mg by mouth daily as needed for moderate pain.    Marland Kitchen levothyroxine (SYNTHROID) 112 MCG tablet Take 1 tablet by mouth once daily 90 tablet 0  . Multiple Vitamin (MULTIVITAMIN WITH MINERALS) TABS Take 1 tablet by mouth daily.     No current facility-administered medications on file prior to visit.   Allergies  Allergen Reactions  . Other     "broke out with cleaner they used on her neck"   Family  history: -Diabetes in aunt and maternal grandfather  PE: BP 122/84   Pulse 68   Ht 5' 1"  (1.549 m)   Wt 173 lb (78.5 kg)   LMP 09/30/2013   SpO2 98%   BMI 32.69 kg/m  Wt Readings from Last 3 Encounters:  04/23/20 173 lb (78.5 kg)  12/19/19 177 lb 0.5 oz (80.3 kg)  11/28/19 175 lb (79.4 kg)   Constitutional: overweight, in NAD Eyes: PERRLA, EOMI, no exophthalmos ENT: moist mucous membranes, no neck masses palpable, no cervical lymphadenopathy Cardiovascular: RRR, No MRG Respiratory: CTA B Gastrointestinal: abdomen soft, NT, ND, BS+ Musculoskeletal: no deformities, strength intact in all 4 Skin: moist, warm, no rashes Neurological: no tremor with outstretched hands, DTR normal in all 4  ASSESSMENT: 1. Thyroid cancer - see HPI  2. Postsurgical Hypothyroidism  PLAN:  1. Thyroid cancer - papillary -She is in remission for her papillary thyroid cancer.  Her thyroglobulin levels remain detectable -Unfortunately I do not have details about the tumor size or pathology, but she did have 3 RAI treatment in the past with persistently detectable thyroglobulin. I explained that the main role of this is to facilitate monitoring in the long run (by checking thyroglobulin).  If the thyroglobulin is detectable, there are most likely persistent cancer cells, but they have not been localized by previous imaging tests.  On the last neck ultrasound the thyroid bed mass has involuted, most likely a benign finding.  This also showed an upper neck mass, so I referred her to Dr. Lucia Gaskins (ENT). She had left superior parotidectomy on 12/19/2019 and the pathology showed mucoepidermoid carcinoma.  -We will continue to follow her by thyroglobulin level.  We will recheck these today. - I will then see the patient in 6 months  2.  Patient with history of total thyroidectomy for thyroid cancer, now with iatrogenic hypothyroidism, on levothyroxine therapy. - latest thyroid labs reviewed with pt >> normal: Lab  Results  Component Value Date   TSH 1.33 10/22/2019   - she continues on LT4 112 mcg daily - pt feels good on this dose. - we discussed about taking the thyroid hormone every day, with water, >30 minutes before breakfast, separated by >4 hours from acid reflux medications, calcium, iron, multivitamins. Pt. is taking it correctly. - will check thyroid tests today: TSH and fT4 -if the TSH is not lower in the normal range we may need to increase the dose of levothyroxine - If labs are abnormal, she will need to return for repeat TFTs in 1.5 months  Needs refills.  Component     Latest Ref Rng & Units 04/23/2020  Thyroglobulin     ng/mL 0.1 (L)  Comment        TSH  mIU/L 0.21 (L)  T4,Free(Direct)     0.8 - 1.8 ng/dL 1.4  Thyroglobulin Ab     < or = 1 IU/mL <1   Due to the persistently elevated thyroglobulin (now improving), I would like to keep her TSH at the lower limit of normal, or slightly lower.  Her TSH is now 0.21 so for now I would suggest to continue the current dose of levothyroxine.  Philemon Kingdom, MD PhD [facility] Endocr[facility]

## 2020-04-26 LAB — T4, FREE: Free T4: 1.4 ng/dL (ref 0.8–1.8)

## 2020-04-26 LAB — TSH: TSH: 0.21 mIU/L — ABNORMAL LOW

## 2020-04-26 LAB — THYROGLOBULIN LEVEL: Thyroglobulin: 0.1 ng/mL — ABNORMAL LOW

## 2020-04-26 LAB — THYROGLOBULIN ANTIBODY: Thyroglobulin Ab: <1 IU/mL (ref <=1)

## 2020-04-27 MED ORDER — LEVOTHYROXINE SODIUM 112 MCG PO TABS: 112.0000 ug | ORAL_TABLET | Freq: Every day | ORAL | 3 refills | Status: DC

## 2020-06-16 ENCOUNTER — Other Ambulatory Visit: Payer: Self-pay

## 2020-06-16 ENCOUNTER — Ambulatory Visit (INDEPENDENT_AMBULATORY_CARE_PROVIDER_SITE_OTHER): Payer: 59 | Admitting: Otolaryngology

## 2020-06-16 ENCOUNTER — Encounter (INDEPENDENT_AMBULATORY_CARE_PROVIDER_SITE_OTHER): Payer: Self-pay | Admitting: Otolaryngology

## 2020-06-16 ENCOUNTER — Ambulatory Visit: Payer: 59 | Admitting: Obstetrics and Gynecology

## 2020-06-16 VITALS — Temp 96.4°F

## 2020-06-16 DIAGNOSIS — Z85818 Personal history of malignant neoplasm of other sites of lip, oral cavity, and pharynx: Secondary | ICD-10-CM

## 2020-06-16 NOTE — Progress Notes (Signed)
HPI: Sharon Moreno is a 49 y.o. 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  Patient is status post left superficial parotidectomy with facial nerve dissection for a left parotid tumor.  Final pathology report revealed a 1.2 cm mucoepidermoid carcinoma with negative margins.  This was performed in June of this year.  She has been doing well but does complain of some numbness around the ear..  Past Medical History:  Diagnosis Date  . Hypothyroidism   . Thyroid disease    Past Surgical History:  Procedure Laterality Date  . CESAREAN SECTION    . PAROTIDECTOMY Left 12/19/2019   Procedure: LEFT PAROTIDECTOMY WITH LEFT FACIAL DISSECTION;  Surgeon: Rozetta Nunnery, MD;  Location: [facility];  Service: ENT;  Laterality: Left;  . THYROIDECTOMY    . TONSILLECTOMY     Social History   Socioeconomic History  . Marital status: Single    Spouse name: Not on file  . Number of children: Not on file  . Years of education: Not on file  . Highest education level: Not on file  Occupational History  . Not on file  Tobacco Use  . Smoking status: Never Smoker  . Smokeless tobacco: Never Used  Vaping Use  . Vaping Use: Never used  Substance and Sexual Activity  . Alcohol use: No  . Drug use: No  . Sexual activity: Not Currently    Birth control/protection: Post-menopausal  Other Topics Concern  . Not on file  Social History Narrative  . Not on file   Social Determinants of Health   Financial Resource Strain: Not on file  Food Insecurity: Not on file  Transportation Needs: Not on file  Physical Activity: Not on file  Stress: Not on file  Social Connections: Not on file   Family History  Problem Relation Age of Onset  . Diabetes Maternal Aunt    Allergies  Allergen Reactions  . Other     "broke out with cleaner they used on her neck"   Prior to Admission medications   Medication Sig Start Date End Date Taking? Authorizing  Provider  aspirin 81 MG chewable tablet Chew 81 mg by mouth daily as needed for moderate pain.    [provider]  levothyroxine (SYNTHROID) 112 MCG tablet Take 1 tablet (112 mcg total) by mouth daily. 04/27/20   Philemon Kingdom, MD  Multiple Vitamin (MULTIVITAMIN WITH MINERALS) TABS Take 1 tablet by mouth daily.    [provider]     Positive ROS: Otherwise negative  All other systems have been reviewed and were otherwise negative with the exception of those mentioned in the HPI and as above.  Physical Exam: Constitutional: Alert, well-appearing, no acute distress Ears: External ears without lesions or tenderness. Ear canals are clear bilaterally with intact, clear TMs.  Nasal: External nose without lesions. Clear nasal passages Oral: Lips and gums without lesions. Tongue and palate mucosa without lesions. Posterior oropharynx clear. Normal facial nerve function. Neck: No palpable adenopathy or masses.  No palpable parotid masses and no palpable adenopathy in the neck. Respiratory: Breathing comfortably  Skin: No facial/neck lesions or rash noted.  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  Plan: She is doing well with no evidence of recurrent or persistent disease. She will follow-up in 6 months for recheck.   Radene Journey, MD

## 2020-10-22 ENCOUNTER — Encounter: Payer: Self-pay | Admitting: Internal Medicine

## 2020-10-22 ENCOUNTER — Ambulatory Visit: Payer: 59 | Admitting: Internal Medicine

## 2020-10-22 ENCOUNTER — Other Ambulatory Visit: Payer: Self-pay

## 2020-10-22 VITALS — BP 120/78 | HR 73 | Ht 61.0 in | Wt 173.0 lb

## 2020-10-22 DIAGNOSIS — E890 Postprocedural hypothyroidism: Secondary | ICD-10-CM

## 2020-10-22 DIAGNOSIS — C73 Malignant neoplasm of thyroid gland: Secondary | ICD-10-CM | POA: Diagnosis not present

## 2020-10-22 NOTE — Progress Notes (Addendum)
Patient ID: Sharon Moreno, female   DOB: Oct 06, 1970, 50 y.o.   MRN: [id number]   This visit occurred during the SARS-CoV-2 public health emergency.  Safety protocols were in place, including screening questions prior to the visit, additional usage of staff PPE, and extensive cleaning of exam room while observing appropriate contact time as indicated for disinfecting solutions.   HPI  Sharon Moreno is a 50 y.o.-year-old female, initially referred by her PCP, Dr. Lujean Amel, returning for follow-up for papillary thyroid cancer and postsurgical hypothyroidism.  Patient was previously seen by Dr. Gilberto Better, in [phone], Dr. Dwyane Dee, last visit 5 years ago.  She also saw Dr. Awilda Metro from 2016-2018.  Our first visit was 6 months ago.  Interim history: She has more mucus in throat - wondering if this is related to allergies. She also feels that her anterior neck is more "puffy". No problems swallowing.  No neck pressure. OTW, no new complaints at this visit  Papillary thyroid carcinoma -Diagnosed in in 1986.  She had 3 RAI treatments.    She also had posttreatment whole-body scans-latest recombinant TSH stimulated whole-body scan was in 2017 and this was negative for metastasis or recurrence.  Reviewed and addended had thyroid cancer history: 1998: Thyroid cancer diagnosed by biopsy, after a thyroid nodule was observed by her mother.   10/1996: Total thyroidectomy: PTC, No further information is available about the pathology 10/1996: RAI tx with 100 mCi 2000: WBS: Negative 05/2001: RAI tx with 100 mCi 03/2002: RAI tx with 150 mCi 05/15/2003: Neck ultrasound: Ultrasound examination demonstrates two nodules in the left side of the neck deep to the anterior musculature where the left lobe of the thyroid gland previously resided. This is just medial to the jugular vein. One of these nodules is 5.5 mm in diameter and the other is just inferior and slightly deeper and measures 5.0 mm. I suspect  these represent recurrence of tumor.   No other abnormality. No evidence of nodules seen to the right of the trachea.   IMPRESSION Two nodules to the left of the trachea at the site of the previous left lobe of the thyroid gland. This is worrisome for tumor recurrence. 2007: WBS: Negative 08/18/2015: rTSH stimulated WBS: Negative  08/14/2016: Neck ultrasound: Parenchymal Echotexture: Surgically absent Isthmus: Surgically absent Right lobe: Surgically absent Left lobe: Surgically absent There is a 0.5 x0.3 x 0.4 cm soft tissue nodule in the thyroidectomy bed just to the left of midline (previously 7 x 3 x 6 mm). IMPRESSION: 1. No interval enlargement of small nonspecific nodule presumably residual/recurrent in the left thyroidectomy bed. 04/25/2019: Neck ultrasound: The thyroid gland is surgically absent. Sonographic interrogation of the right and left resection beds demonstrates no evidence of residual or recurrent thyroid tissue or nodularity. There is no evidence of lymphadenopathy.   However, there is a circumscribed anechoic structure with posterior acoustic enhancement in the superior aspect of the left neck measuring 0.6 by 0.7 by 1.2 cm. This has not been previously identified.  In the midline, inferior to the prior thyroid isthmus there is a small hypoechoic solid nodule measuring 0.4 x 0.3 x 0.4 cm. This has slightly decreased in size compared to January of 2017 when the abnormality measured 0.7 x 0.3 x 0.6 cm.  IMPRESSION: 1. Slowly decreasing size of small nodule inferior to the prior thyroid isthmus compared to prior studies dating back to January of 2017. Involution over time suggests a benign process. 2. Nonspecific simple appearing cyst in the superior  aspect of the right neck measures 1.2 x 0.6 x 0.7 cm and has not been previously identified. This is of uncertain etiology and significance. While this is likely benign, any new cystic lesion in the neck should  be evaluated further to exclude the possibility of necrotic lymphadenopathy. Recommend contrast-enhanced CT scan of the neck. 3. Surgical changes of total thyroidectomy. 05/22/2019: CT neck: 1. 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 Recommend ENT referral. 2. No cystic mass or lymphadenopathy in the neck to correspond to the recent ultrasound. The largest lymph nodes are at the right level 2 station measuring 8-9 mm short axis, and remain normal by CT criteria. 3. Negative thyroid bed. 4. Ectatic distal aortic arch, about 30 mm diameter. Recommend annual imaging followup by Chest CTA or MRA.  Patient was referred to ENT.  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  Also, I sent the results to PCP regarding the distal aortic arch dilation.  Thyroglobulin levels remains detectable while ATA antibodies were negative: Lab Results  Component Value Date   THYROGLB 0.1 (L) 04/23/2020   THYROGLB 0.3 (L) 04/22/2019   THYROGLB 0.3 10/01/2013   Lab Results  Component Value Date   THGAB <1 04/23/2020   THGAB <1 04/22/2019   08/19/2014: Tg 0.5, ATA <0.1 05/05/2015: ATA <1.0  Pt denies: - feeling nodules in neck - hoarseness - dysphagia - choking - SOB with lying down  Postsurgical hypothyroidism:  Pt is on levothyroxine 112 mcg daily, taken: - in am - fasting - at least 30 min from b'fast - no Ca, Fe, PPIs - +  MVI at night - not on Biotin On vitamin C.  Reviewed TFTs: Lab Results  Component Value Date   TSH 0.21 (L) 04/23/2020   TSH 1.33 10/22/2019   TSH 3.04 04/22/2019   TSH 1.99 10/01/2013   TSH 1.21 01/28/2013   FREET4 1.4 04/23/2020   FREET4 1.16 10/22/2019   FREET4 0.95 04/22/2019   FREET4 1.01 10/01/2013   FREET4 1.02 01/28/2013  01/10/2019: TSH 0.119 11/05/2018: TSH  0.03 08/07/2018: TSH 6.01   She continues to have hot flashes.  No FH of thyroid disease or cancer. No h/o radiation tx to head or neck other than RAI treatment.  No herbal supplements. + Biotin use now off. No recent steroids use.   She is off vitamin D supplements.  Sh has FH of DM.  ROS: + See HPI Constitutional: no weight gain/no weight loss, no fatigue, no subjective hyperthermia, no subjective hypothermia Eyes: no blurry vision, no xerophthalmia ENT: no sore throat, + see HPI Cardiovascular: no CP/no SOB/no palpitations/no leg swelling Respiratory: no cough/no SOB/no wheezing Gastrointestinal: no N/no V/no D/no C/no acid reflux Musculoskeletal: no muscle aches/no joint aches Skin: no rashes, no hair loss Neurological: no tremors/no numbness/no tingling/no dizziness  I reviewed pt's medications, allergies, PMH, social hx, family hx, and changes were documented in the history of present illness. Otherwise, unchanged from my initial visit note.  Past Medical History:  Diagnosis Date  . Hypothyroidism   . Thyroid disease     Surgeries: -Please see HPI  Social History   Socioeconomic History  . Marital status: Married    Spouse name: Not on file  . Number of children: 2: 32 and 76 years old in 04/2019  . Years of education: Not  on file  . Highest education level: Not on file  Occupational History  .  Caseworker for social services  Social Needs  . Financial resource strain: Not on file  . Food insecurity    Worry: Not on file    Inability: Not on file  . Transportation needs    Medical: Not on file    Non-medical: Not on file  Tobacco Use  . Smoking status: Never Smoker  . Smokeless tobacco: Never Used  Substance and Sexual Activity  . Alcohol use: No  . Drug use: No   Current Outpatient Medications on File Prior to Visit  Medication Sig Dispense Refill  . aspirin 81 MG chewable tablet Chew 81 mg by mouth daily as needed for moderate pain.    Marland Kitchen  levothyroxine (SYNTHROID) 112 MCG tablet Take 1 tablet (112 mcg total) by mouth daily. 90 tablet 3  . Multiple Vitamin (MULTIVITAMIN WITH MINERALS) TABS Take 1 tablet by mouth daily.     No current facility-administered medications on file prior to visit.   Allergies  Allergen Reactions  . Other     "broke out with cleaner they used on her neck"   Family history: -Diabetes in aunt and maternal grandfather  PE: BP 120/78 (BP Location: Right Arm, Patient Position: Sitting, Cuff Size: Normal)   Pulse 73   Ht '5\' 1"'  (1.549 m)   Wt 173 lb (78.5 kg)   LMP 09/30/2013   SpO2 99%   BMI 32.69 kg/m  Wt Readings from Last 3 Encounters:  10/22/20 173 lb (78.5 kg)  04/23/20 173 lb (78.5 kg)  12/19/19 177 lb 0.5 oz (80.3 kg)   Constitutional: overweight, in NAD Eyes: PERRLA, EOMI, no exophthalmos ENT: moist mucous membranes, no neck masses palpable, no cervical lymphadenopathy Cardiovascular: RRR, No MRG Respiratory: CTA B Gastrointestinal: abdomen soft, NT, ND, BS+ Musculoskeletal: no deformities, strength intact in all 4 Skin: moist, warm, no rashes Neurological: no tremor with outstretched hands, DTR normal in all 4  ASSESSMENT: 1. Thyroid cancer - see HPI  2. Postsurgical Hypothyroidism  PLAN:  1. Thyroid cancer - papillary -She is in remission for her papillary thyroid cancer.  Her thyroglobulin levels remain detectable, but the level was lower at last check. -I do not have details about the tumor size or pathology, but she did have 3 RAI treatments in the past with persistently detectable thyroglobulin.  We discussed at last visits that the main role of this is to facilitate monitoring in the long run, by checking thyroglobulin.  If the thyroglobulin is detectable, there are most likely persistent cancer cells, but they have not been localized by previous imaging studies.  On the latest neck ultrasound, the thyroid bed mass has involuted, most likely a benign finding.  This also  showed an upper neck mass so I referred her to Dr. Lucia Gaskins with ENT.  She had left superior parotidectomy in 12/2019 >> mucoepidermoid carcinoma. -We will continue to follow her by thyroglobulin levels -we will recheck these at next visit - However, at this visit, she c/o more apparent swelling in her anterior neck and also increase mucus in her throat.  I do not feel abnormalities on palpation, but I would like to obtain a new neck ultrasound at this point. -She is wondering about whether she needs other treatments or not.  We discussed about treatment available: another RAI treatment (I would not recommend this), surgery, TKIs.  As of now, she does not need any of these.  For  now, we will continue to keep an eye on her thyroglobulin and we will continue to image her neck. - I will then see the patient in 6 months  2.  Patient with history of total thyroidectomy for thyroid cancer, now with iatrogenic hypothyroidism, on levothyroxine therapy - latest thyroid labs reviewed with pt >> normal: Lab Results  Component Value Date   TSH 0.21 (L) 04/23/2020   - she continues on LT4 112 mcg daily - pt feels good on this dose. - we discussed about taking the thyroid hormone every day, with water, >30 minutes before breakfast, separated by >4 hours from acid reflux medications, calcium, iron, multivitamins. Pt. is taking it correctly. - will check thyroid tests today: TSH and fT4 -her TSH target is in the lower end of the normal range or slightly below the LLN.  I explained the rationale for this. - If labs are abnormal, she will need to return for repeat TFTs in 1.5 months  Component     Latest Ref Rng & Units 10/22/2020  TSH     0.450 - 4.500 uIU/mL 0.222 (L)  T4,Free(Direct)     0.82 - 1.77 ng/dL 1.75  Stable TFTs with TSH at goal.  Neck U/S (11/02/2020): Parenchymal Echotexture: None identified Isthmus: Surgically absent Right lobe: Surgically absent Left lobe: Surgically  absent _________________________________________________________  0.4 cm hypoechoic nodule in the midline of the thyroidectomy bed, previously 0.4. 0.2 cm hypoechoic nodule in the left thyroidectomy bed, not described on prior study. 2 adjacent 0.3 cm hypoechoic nodules in the right thyroid lobectomy bed. _________________________________________________________  Right cervical lymph nodes measuring up to 0.8 cm short axis diameter.  IMPRESSION: 1. Small soft tissue foci in the thyroidectomy bed as above. 2. Single prominent right cervical lymph node 0.7 cm short axis diameter, nonspecific  The above is in keeping with the ACR TI-RADS recommendations - J Am Coll Radiol 2017;14:587-595.  Electronically Signed   By: Lucrezia Europe M.D.   On: 11/02/2020 16:31  She has very small hypoechoic nodules in the thyroidectomy bed.  For now, I would recommend to obtain another ultrasound in 6 to 12 months and if growing, may need surgical re-intervention.  I believe these are too small to be biopsied.  Philemon Kingdom, MD PhD [facility]

## 2020-10-22 NOTE — Patient Instructions (Signed)
Please stop at the lab.  Please continue Levothyroxine 112 mcg daily.  Take the thyroid hormone every day, with water, at least 30 minutes before breakfast, separated by at least 4 hours from: - acid reflux medications - calcium - iron - multivitamins  You will be called with the thyroid U/S schedule.  Please come back for a follow-up appointment in 6 months.

## 2020-10-23 LAB — TSH: TSH: 0.222 u[IU]/mL — ABNORMAL LOW (ref 0.450–4.500)

## 2020-10-23 LAB — T4, FREE: Free T4: 1.75 ng/dL (ref 0.82–1.77)

## 2020-10-25 ENCOUNTER — Encounter: Payer: Self-pay | Admitting: Internal Medicine

## 2020-11-02 ENCOUNTER — Ambulatory Visit
Admission: RE | Admit: 2020-11-02 | Discharge: 2020-11-02 | Disposition: A | Discharge status: Home / Self Care | Payer: 59 | Source: Ambulatory Visit | Attending: Internal Medicine | Admitting: Internal Medicine

## 2020-11-02 DIAGNOSIS — C73 Malignant neoplasm of thyroid gland: Secondary | ICD-10-CM

## 2020-12-15 ENCOUNTER — Ambulatory Visit (INDEPENDENT_AMBULATORY_CARE_PROVIDER_SITE_OTHER): Payer: 59 | Admitting: Otolaryngology

## 2021-04-25 ENCOUNTER — Ambulatory Visit: Payer: 59 | Admitting: Internal Medicine

## 2021-05-19 ENCOUNTER — Other Ambulatory Visit: Payer: Self-pay | Admitting: Internal Medicine

## 2021-05-23 ENCOUNTER — Other Ambulatory Visit: Payer: Self-pay | Admitting: Internal Medicine

## 2021-06-06 ENCOUNTER — Ambulatory Visit (INDEPENDENT_AMBULATORY_CARE_PROVIDER_SITE_OTHER): Payer: 59 | Admitting: Internal Medicine

## 2021-06-06 ENCOUNTER — Encounter: Payer: Self-pay | Admitting: Internal Medicine

## 2021-06-06 ENCOUNTER — Other Ambulatory Visit: Payer: Self-pay

## 2021-06-06 VITALS — BP 120/78 | HR 74 | Ht 61.0 in | Wt 162.8 lb

## 2021-06-06 DIAGNOSIS — E890 Postprocedural hypothyroidism: Secondary | ICD-10-CM

## 2021-06-06 DIAGNOSIS — C73 Malignant neoplasm of thyroid gland: Secondary | ICD-10-CM

## 2021-06-06 LAB — T4, FREE: Free T4: 1.39 ng/dL (ref 0.60–1.60)

## 2021-06-06 LAB — TSH: TSH: 0.09 u[IU]/mL — ABNORMAL LOW (ref 0.35–5.50)

## 2021-06-06 MED ORDER — LEVOTHYROXINE SODIUM 100 MCG PO TABS: 100.0000 ug | ORAL_TABLET | Freq: Every day | ORAL | 5 refills | Status: DC

## 2021-06-06 NOTE — Progress Notes (Addendum)
She is patient ID: Sharon Moreno, female   DOB: 05/19/1971, 50 y.o.   MRN: [id number]   This visit occurred during the SARS-CoV-2 public health emergency.  Safety protocols were in place, including screening questions prior to the visit, additional usage of staff PPE, and extensive cleaning of exam room while observing appropriate contact time as indicated for disinfecting solutions.   HPI  Sharon Moreno is a 50 y.o.-year-old female, initially referred by her PCP, Dr. Lujean Amel, returning for follow-up for papillary thyroid cancer and postsurgical hypothyroidism.  Patient was previously seen by Dr. Gilberto Better, in [phone], Dr. Dwyane Dee, last visit 5 years ago.  She also saw Dr. Awilda Metro from 2016-2018.  Last visit with me 7 months ago.  Interim history: No neck pressure since last visit.  She occasionally feels something stuck in the throat, rarely choking.  No tremors, palpitations, or unintentional weight loss (however, at today's visit she weighs 11 pounds less than )at last visit.  Papillary thyroid carcinoma She also had posttreatment whole-body scans-latest recombinant TSH stimulated whole-body scan was in 2017 and this was negative for metastasis or recurrence.  Reviewed and addended had thyroid cancer history: 1998: Thyroid cancer diagnosed by biopsy, after a thyroid nodule was observed by her mother.   10/1996: Total thyroidectomy: PTC, No further information is available about the pathology 10/1996: RAI tx with 100 mCi 2000: WBS: Negative 05/2001: RAI tx with 100 mCi 03/2002: RAI tx with 150 mCi 05/15/2003: Neck ultrasound: Ultrasound examination demonstrates two nodules in the left side of the neck deep to the anterior musculature where the left lobe of the thyroid gland previously resided. This is just medial to the jugular vein. One of these nodules is 5.5 mm in diameter and the other is just inferior and slightly deeper and measures 5.0 mm. I suspect these represent  recurrence of tumor.   No other abnormality. No evidence of nodules seen to the right of the trachea.   IMPRESSION Two nodules to the left of the trachea at the site of the previous left lobe of the thyroid gland. This is worrisome for tumor recurrence. 2007: WBS: Negative 08/18/2015: rTSH stimulated WBS: Negative  08/14/2016: Neck ultrasound: Parenchymal Echotexture: Surgically absent Isthmus: Surgically absent Right lobe: Surgically absent Left lobe: Surgically absent There is a 0.5 x 0.3 x 0.4 cm soft tissue nodule in the thyroidectomy bed just to the left of midline (previously 7 x 3 x 6 mm). IMPRESSION: 1. No interval enlargement of small nonspecific nodule presumably residual/recurrent in the left thyroidectomy bed. 04/25/2019: Neck ultrasound: The thyroid gland is surgically absent. Sonographic interrogation of the right and left resection beds demonstrates no evidence of residual or recurrent thyroid tissue or nodularity. There is no evidence of lymphadenopathy.   However, there is a circumscribed anechoic structure with posterior acoustic enhancement in the superior aspect of the left neck measuring 0.6 by 0.7 by 1.2 cm. This has not been previously identified.   In the midline, inferior to the prior thyroid isthmus there is a small hypoechoic solid nodule measuring 0.4 x 0.3 x 0.4 cm. This has slightly decreased in size compared to January of 2017 when the abnormality measured 0.7 x 0.3 x 0.6 cm.   IMPRESSION: 1. Slowly decreasing size of small nodule inferior to the prior thyroid isthmus compared to prior studies dating back to January of 2017. Involution over time suggests a benign process. 2. Nonspecific simple appearing cyst in the superior aspect of the right neck measures 1.2 x  0.6 x 0.7 cm and has not been previously identified. This is of uncertain etiology and significance. While this is likely benign, any new cystic lesion in the neck should be evaluated  further to exclude the possibility of necrotic lymphadenopathy. Recommend contrast-enhanced CT scan of the neck. 3. Surgical changes of total thyroidectomy. 05/22/2019: CT neck: 1. 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 Recommend ENT referral. 2. No cystic mass or lymphadenopathy in the neck to correspond to the recent ultrasound. The largest lymph nodes are at the right level 2 station measuring 8-9 mm short axis, and remain normal by CT criteria. 3. Negative thyroid bed. 4. Ectatic distal aortic arch, about 30 mm diameter. Recommend annual imaging followup by Chest CTA or MRA. Patient was referred to ENT.  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 Also, I sent the results to PCP regarding the distal aortic arch dilation. Neck U/S (11/02/2020): Parenchymal Echotexture: None identified Isthmus: Surgically absent Right lobe: Surgically absent Left lobe: Surgically absent _________________________________________________________   0.4 cm hypoechoic nodule in the midline of the thyroidectomy bed, previously 0.4. 0.2 cm hypoechoic nodule in the left thyroidectomy bed, not described on prior study. 2 adjacent 0.3 cm hypoechoic nodules in the right thyroid lobectomy bed. _________________________________________________________   Right cervical lymph nodes measuring up to 0.8 cm short axis diameter.   IMPRESSION: 1. Small soft tissue foci in the thyroidectomy bed as above. 2. Single prominent right cervical lymph node 0.7 cm short axis diameter, nonspecific   She has very small hypoechoic nodules in the thyroidectomy bed, too small to be biopsied. I recommended to obtain another ultrasound in 6 to 12 months and if growing, may need surgical re-intervention.  Thyroglobulin levels remains detectable  while ATA antibodies were negative: Lab Results  Component Value Date   THYROGLB 0.1 (L) 04/23/2020   THYROGLB 0.3 (L) 04/22/2019   THYROGLB 0.3 10/01/2013   Lab Results  Component Value Date   THGAB <1 04/23/2020   THGAB <1 04/22/2019   08/19/2014: Tg 0.5, ATA <0.1 05/05/2015: ATA <1.0  Pt denies: - feeling nodules in neck - hoarseness - dysphagia - choking - SOB with lying down  Postsurgical hypothyroidism:  Pt is on levothyroxine 112 mcg daily, taken: - in am - fasting - at least 30 min from b'fast - no Ca, Fe, PPIs - +  MVI at night - not on Biotin On vitamin C.  Reviewed TFTs: Lab Results  Component Value Date   TSH 0.222 (L) 10/22/2020   TSH 0.21 (L) 04/23/2020   TSH 1.33 10/22/2019   TSH 3.04 04/22/2019   TSH 1.99 10/01/2013   TSH 1.21 01/28/2013   FREET4 1.75 10/22/2020   FREET4 1.4 04/23/2020   FREET4 1.16 10/22/2019   FREET4 0.95 04/22/2019   FREET4 1.01 10/01/2013   FREET4 1.02 01/28/2013  01/10/2019: TSH 0.119 11/05/2018: TSH 0.03 08/07/2018: TSH 6.01   She continues to have hot flashes.  No FH of thyroid disease or cancer. No h/o radiation tx to head or neck other than RAI treatment.  No herbal supplements. + Biotin use now off. No recent steroids use.   She is off vitamin D supplements.  Sh has FH of DM.  ROS: + See HPI  I reviewed pt's medications, allergies, PMH, social hx, family hx, and changes were documented in the  history of present illness. Otherwise, unchanged from my initial visit note.  Past Medical History:  Diagnosis Date   Hypothyroidism    Thyroid disease     Surgeries: -Please see HPI  Social History   Socioeconomic History   Marital status: Married    Spouse name: Not on file   Number of children: 2: 76 and 61 years old in 04/2019   Years of education: Not on file   Highest education level: Not on file  Occupational History    Caseworker for social services  Social Needs   Financial resource strain:  Not on file   Food insecurity    Worry: Not on file    Inability: Not on file   Transportation needs    Medical: Not on file    Non-medical: Not on file  Tobacco Use   Smoking status: Never Smoker   Smokeless tobacco: Never Used  Substance and Sexual Activity   Alcohol use: No   Drug use: No   Current Outpatient Medications on File Prior to Visit  Medication Sig Dispense Refill   aspirin 81 MG chewable tablet Chew 81 mg by mouth daily as needed for moderate pain.     levothyroxine (SYNTHROID) 112 MCG tablet Take 1 tablet by mouth once daily 30 tablet 0   Multiple Vitamin (MULTIVITAMIN WITH MINERALS) TABS Take 1 tablet by mouth daily.     No current facility-administered medications on file prior to visit.   No Known Allergies  Family history: -Diabetes in aunt and maternal grandfather  PE: BP 120/78 (BP Location: Right Arm, Patient Position: Sitting, Cuff Size: Normal)   Pulse 74   Ht '5\' 1"'  (1.549 m)   Wt 162 lb 12.8 oz (73.8 kg)   LMP 09/30/2013   SpO2 98%   BMI 30.76 kg/m  Wt Readings from Last 3 Encounters:  06/06/21 162 lb 12.8 oz (73.8 kg)  10/22/20 173 lb (78.5 kg)  04/23/20 173 lb (78.5 kg)   Constitutional: overweight, in NAD Eyes: PERRLA, EOMI, no exophthalmos ENT: moist mucous membranes, no neck masses palpable, no cervical lymphadenopathy Cardiovascular: RRR, No MRG Respiratory: CTA B Musculoskeletal: no deformities, strength intact in all 4 Skin: moist, warm, no rashes Neurological: no tremor with outstretched hands, DTR normal in all 4  ASSESSMENT: 1. Thyroid cancer - see HPI  2. Postsurgical Hypothyroidism  PLAN:  1. Thyroid cancer - papillary -She is in remission for her PTC.  Her thyroglobulin levels remain detectable, but the latest level was lower than before -I do not have details about the tumor size or pathology, but she did have 3 RAI treatments in the past with persistently detectable thyroglobulin.  We discussed in the past that the  main the role of this is to facilitate monitoring in the long run, by checking thyroglobulin.  If the thyroglobulin is detectable, there are most likely persistent cancer cells but these have not been localized by previous imaging studies.  She did have a thyroid bed mass which involuted on the ultrasound from 04/2019.  We discussed that this is consistent with a benign finding.  We repeated a neck ultrasound in 10/2020 and this showed stability of the mass.  At that time, also noticed, were a 2 mm and 2 x 3 mm masses in the low right thyroid surgical bed respectively.  We discussed about checking another ultrasound of these in 6 to 12 months after the previous. -Of note she also had an upper neck mass on the ultrasound from  2020 and I referred her to Dr. Lucia Gaskins with ENT.  She had left superior parathyroidectomy in 12/2019 that showed mucoepidermoid carcinoma. -We will continue to follow her by thyroglobulin levels-we will recheck the levels today -As of now we will proceed with another neck ultrasound -At this visit we again reviewed the CT neck results from 2020.  She did not have follow-up repeat scans by PCP for the ectatic aortic arch.  I advised her to call and discuss with PCP about having imaging follow-up for this. - I will then see the patient in 6 months  2.  Patient with history of total thyroidectomy for thyroid cancer, now with iatrogenic hypothyroidism, on levothyroxine therapy - latest thyroid labs reviewed with pt. >> normal: Lab Results  Component Value Date   TSH 0.222 (L) 10/22/2020  - she continues on LT4 112 mcg daily - pt feels good on this dose. - we discussed about taking the thyroid hormone every day, with water, >30 minutes before breakfast, separated by >4 hours from acid reflux medications, calcium, iron, multivitamins. Pt. is taking it correctly. - will check thyroid tests today: TSH and fT4; our target is a TSH slightly lower than normal range due to her history of  papillary thyroid cancer - If labs are abnormal, she will need to return for repeat TFTs in 1.5 months  Needs refills.  Component     Latest Ref Rng & Units 06/06/2021  TSH     0.35 - 5.50 uIU/mL 0.09 (L)  T4,Free(Direct)     0.60 - 1.60 ng/dL 1.39  TSH is slightly too low now.  I will advise her to decrease the dose of levothyroxine to 100 mcg daily and have her back for labs in 1.5 months.  Component     Latest Ref Rng & Units 06/06/2021  Thyroglobulin     ng/mL 0.2 (L)  Comment        Thyroglobulin Ab     < or = 1 IU/mL <1  Thyroglobulin is slightly higher than before, detectable.  We will await the results of her ultrasound.  I plan to repeat her thyroglobulin in 6 months.  Neck U/S (06/09/2021): Patient is status post total thyroidectomy.   Stable 4 mm hypoechoic well-circumscribed nodule in the midline.   No other thyroid bed abnormality, nodule, residual thyroid tissue, or mass by ultrasound. No regional adenopathy.   IMPRESSION: Stable 4 mm isthmus surgical bed nodule.   No other significant finding by ultrasound.  Lesion appears to be very stable, too small to biopsy, and it was not picked up on the CT scan or previous whole-body scans.  For now, we will continue to follow it.  Philemon Kingdom, MD PhD [facility]

## 2021-06-06 NOTE — Patient Instructions (Addendum)
Please stop at the lab.  Please continue Levothyroxine 112 mcg daily.  Take the thyroid hormone every day, with water, at least 30 minutes before breakfast, separated by at least 4 hours from: - acid reflux medications - calcium - iron - multivitamins  You will be called with the thyroid U/S schedule.  Please discuss with your PCP about the results of the neck CT from 05/21/2021: 1. 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 Recommend ENT referral. 2. No cystic mass or lymphadenopathy in the neck to correspond to the recent ultrasound. The largest lymph nodes are at the right level 2 station measuring 8-9 mm short axis, and remain normal by CT criteria. 3. Negative thyroid bed. 4. Ectatic distal aortic arch, about 30 mm diameter. Recommend annual imaging followup by Chest CTA or MRA.  Please come back for a follow-up appointment in 6 months.

## 2021-06-07 LAB — THYROGLOBULIN LEVEL: Thyroglobulin: 0.2 ng/mL — ABNORMAL LOW

## 2021-06-07 LAB — THYROGLOBULIN ANTIBODY: Thyroglobulin Ab: <1 IU/mL (ref <=1)

## 2021-06-09 ENCOUNTER — Ambulatory Visit
Admission: RE | Admit: 2021-06-09 | Discharge: 2021-06-09 | Disposition: A | Discharge status: Home / Self Care | Payer: 59 | Source: Ambulatory Visit | Attending: Internal Medicine | Admitting: Internal Medicine

## 2021-06-09 DIAGNOSIS — C73 Malignant neoplasm of thyroid gland: Secondary | ICD-10-CM

## 2021-06-10 ENCOUNTER — Telehealth: Payer: Self-pay

## 2021-06-10 NOTE — Telephone Encounter (Addendum)
Called and left a detailed message of results. ----- Message from Philemon Kingdom, MD sent at 06/10/2021  4:53 PM EST ----- Can you please call pt.:  Result of the neck ultrasound is back: She does have a nodule in the space where the thyroid was before, and this has been seen on previous ultrasounds and appears to be stable.  For now, we only need to follow it.

## 2021-06-17 ENCOUNTER — Other Ambulatory Visit: Payer: 59

## 2021-06-23 ENCOUNTER — Other Ambulatory Visit: Payer: Self-pay

## 2021-06-23 ENCOUNTER — Ambulatory Visit: Payer: 59 | Admitting: Family Medicine

## 2021-06-23 ENCOUNTER — Encounter: Payer: Self-pay | Admitting: Family Medicine

## 2021-06-23 VITALS — BP 111/7 | HR 68 | Temp 98.1°F | Resp 16 | Ht 61.0 in | Wt 161.0 lb

## 2021-06-23 DIAGNOSIS — Z7689 Persons encountering health services in other specified circumstances: Secondary | ICD-10-CM

## 2021-06-23 DIAGNOSIS — K219 Gastro-esophageal reflux disease without esophagitis: Secondary | ICD-10-CM | POA: Diagnosis not present

## 2021-06-23 MED ORDER — SUCRALFATE 1 G PO TABS: 1.0000 g | ORAL_TABLET | Freq: Three times a day (TID) | ORAL | 1 refills | Status: DC

## 2021-06-23 MED ORDER — OMEPRAZOLE 40 MG PO CPDR: 40.0000 mg | DELAYED_RELEASE_CAPSULE | Freq: Every day | ORAL | 1 refills | Status: DC

## 2021-06-23 NOTE — Progress Notes (Signed)
Patient is new to PCP.  Patient is concern about waking up and having a blood taste in her mouth

## 2021-06-24 ENCOUNTER — Encounter: Payer: Self-pay | Admitting: Family Medicine

## 2021-06-24 NOTE — Progress Notes (Signed)
New Patient Office Visit  Subjective:  Patient ID: Sharon Moreno, female    DOB: 20-Oct-1970  Age: 50 y.o. MRN: [id number]  CC:  Chief Complaint  Patient presents with   Establish Care    HPI Sharon Moreno presents for to establish care. Patient also reports that she awakens intermittently with a foul taste (?blood) in her mouth.   Past Medical History:  Diagnosis Date   Hypothyroidism    Thyroid disease     Past Surgical History:  Procedure Laterality Date   CESAREAN SECTION     PAROTIDECTOMY Left 12/19/2019   Procedure: LEFT PAROTIDECTOMY WITH LEFT FACIAL DISSECTION;  Surgeon: Rozetta Nunnery, MD;  Location: [facility];  Service: ENT;  Laterality: Left;   THYROIDECTOMY     TONSILLECTOMY      Family History  Problem Relation Age of Onset   Diabetes Maternal Aunt     Social History   Socioeconomic History   Marital status: Single    Spouse name: Not on file   Number of children: Not on file   Years of education: Not on file   Highest education level: Not on file  Occupational History   Not on file  Tobacco Use   Smoking status: Never   Smokeless tobacco: Never  Vaping Use   Vaping Use: Never used  Substance and Sexual Activity   Alcohol use: No   Drug use: No   Sexual activity: Not Currently    Birth control/protection: Post-menopausal  Other Topics Concern   Not on file  Social History Narrative   Not on file   Social Determinants of Health   Financial Resource Strain: Not on file  Food Insecurity: Not on file  Transportation Needs: Not on file  Physical Activity: Not on file  Stress: Not on file  Social Connections: Not on file  Intimate Partner Violence: Not on file    ROS Review of Systems  Constitutional:  Negative for chills and fever.  HENT:  Negative for dental problem, mouth sores, sore throat and trouble swallowing.   All other systems reviewed and are negative.  Objective:   Today's Vitals: BP (!) 111/7     Pulse 68    Temp 98.1 F (36.7 C) (Oral)    Resp 16    Ht 5\' 1"  (1.549 m)    Wt 161 lb (73 kg)    LMP 09/30/2013    SpO2 98%    BMI 30.42 kg/m   Physical Exam Vitals and nursing note reviewed.  Constitutional:      General: She is not in acute distress. HENT:     Head: Normocephalic and atraumatic.     Mouth/Throat:     Mouth: Mucous membranes are moist.     Pharynx: Oropharynx is clear.  Neck:     Thyroid: No thyromegaly.  Cardiovascular:     Rate and Rhythm: Normal rate and regular rhythm.  Pulmonary:     Effort: Pulmonary effort is normal.     Breath sounds: Normal breath sounds.  Abdominal:     Palpations: Abdomen is soft.     Tenderness: There is no abdominal tenderness.  Musculoskeletal:     Cervical back: Normal range of motion and neck supple.  Neurological:     General: No focal deficit present.     Mental Status: She is alert and oriented to person, place, and time.    Assessment & Plan:   1. Gastroesophageal reflux disease, unspecified whether  esophagitis present Discussed dietary and activity options in detail. Omeprazole and carafate prescribed. Will monitor  2. Encounter to establish care     Outpatient Encounter Medications as of 06/23/2021  Medication Sig   levothyroxine (SYNTHROID) 100 MCG tablet Take 1 tablet (100 mcg total) by mouth daily.   Multiple Vitamin (MULTIVITAMIN WITH MINERALS) TABS Take 1 tablet by mouth daily.   omeprazole (PRILOSEC) 40 MG capsule Take 1 capsule (40 mg total) by mouth daily.   sucralfate (CARAFATE) 1 g tablet Take 1 tablet (1 g total) by mouth 4 (four) times daily -  with meals and at bedtime.   aspirin 81 MG chewable tablet Chew 81 mg by mouth daily as needed for moderate pain. (Patient not taking: Reported on 06/23/2021)   No facility-administered encounter medications on file as of 06/23/2021.    Follow-up: Return in about 4 weeks (around 07/21/2021) for follow up.   Becky Sax, MD

## 2021-07-05 ENCOUNTER — Encounter: Payer: Self-pay | Admitting: Family Medicine

## 2021-07-19 ENCOUNTER — Other Ambulatory Visit: Payer: 59

## 2021-07-26 ENCOUNTER — Other Ambulatory Visit (INDEPENDENT_AMBULATORY_CARE_PROVIDER_SITE_OTHER): Payer: 59

## 2021-07-26 ENCOUNTER — Other Ambulatory Visit: Payer: Self-pay

## 2021-07-26 DIAGNOSIS — E890 Postprocedural hypothyroidism: Secondary | ICD-10-CM

## 2021-07-26 LAB — T4, FREE: Free T4: 1.3 ng/dL (ref 0.60–1.60)

## 2021-07-26 LAB — TSH: TSH: 0.04 u[IU]/mL — ABNORMAL LOW (ref 0.35–5.50)

## 2021-07-27 ENCOUNTER — Other Ambulatory Visit: Payer: Self-pay | Admitting: Internal Medicine

## 2021-07-27 DIAGNOSIS — E890 Postprocedural hypothyroidism: Secondary | ICD-10-CM

## 2021-07-27 MED ORDER — LEVOTHYROXINE SODIUM 88 MCG PO TABS: 88.0000 ug | ORAL_TABLET | Freq: Every day | ORAL | 3 refills | Status: DC

## 2021-07-28 ENCOUNTER — Ambulatory Visit: Payer: 59 | Admitting: Family Medicine

## 2021-07-28 ENCOUNTER — Encounter: Payer: Self-pay | Admitting: Internal Medicine

## 2021-08-11 ENCOUNTER — Ambulatory Visit: Payer: 59 | Admitting: Family Medicine

## 2021-08-11 ENCOUNTER — Encounter: Payer: Self-pay | Admitting: Family Medicine

## 2021-08-11 ENCOUNTER — Other Ambulatory Visit: Payer: Self-pay

## 2021-08-11 VITALS — BP 108/74 | HR 74 | Temp 97.6°F | Resp 16 | Wt 159.0 lb

## 2021-08-11 DIAGNOSIS — K219 Gastro-esophageal reflux disease without esophagitis: Secondary | ICD-10-CM | POA: Diagnosis not present

## 2021-08-11 NOTE — Progress Notes (Signed)
Patient is here for f/up stomach pain . Patient said the pain is doing much better   Patient has no other concerns today

## 2021-08-12 ENCOUNTER — Encounter: Payer: Self-pay | Admitting: Family Medicine

## 2021-08-12 NOTE — Progress Notes (Signed)
Established Patient Office Visit  Subjective:  Patient ID: Sharon Moreno, female    DOB: 02-Mar-1971  Age: 51 y.o. MRN: [id number]  CC:  Chief Complaint  Patient presents with   Follow-up   Abdominal Pain    HPI DESHAWNDA ACREY presents for follow up of GER. Patient reports improvement with present management but also is having some issues with bringing up mucous. Denies fever/chills.   Past Medical History:  Diagnosis Date   Hypothyroidism    Thyroid disease     Past Surgical History:  Procedure Laterality Date   CESAREAN SECTION     PAROTIDECTOMY Left 12/19/2019   Procedure: LEFT PAROTIDECTOMY WITH LEFT FACIAL DISSECTION;  Surgeon: Rozetta Nunnery, MD;  Location: [facility];  Service: ENT;  Laterality: Left;   THYROIDECTOMY     TONSILLECTOMY      Family History  Problem Relation Age of Onset   Diabetes Maternal Aunt     Social History   Socioeconomic History   Marital status: Single    Spouse name: Not on file   Number of children: Not on file   Years of education: Not on file   Highest education level: Not on file  Occupational History   Not on file  Tobacco Use   Smoking status: Never   Smokeless tobacco: Never  Vaping Use   Vaping Use: Never used  Substance and Sexual Activity   Alcohol use: No   Drug use: No   Sexual activity: Not Currently    Birth control/protection: Post-menopausal  Other Topics Concern   Not on file  Social History Narrative   Not on file   Social Determinants of Health   Financial Resource Strain: Not on file  Food Insecurity: Not on file  Transportation Needs: Not on file  Physical Activity: Not on file  Stress: Not on file  Social Connections: Not on file  Intimate Partner Violence: Not on file    ROS Review of Systems  Constitutional:  Negative for chills and fever.  Gastrointestinal:  Negative for abdominal pain, constipation, diarrhea and vomiting.  All other systems reviewed and are  negative.  Objective:   Today's Vitals: BP 108/74    Pulse 74    Temp 97.6 F (36.4 C) (Oral)    Resp 16    Wt 159 lb (72.1 kg)    LMP 09/30/2013    SpO2 97%    BMI 30.04 kg/m   Physical Exam Vitals and nursing note reviewed.  Constitutional:      General: She is not in acute distress. Cardiovascular:     Rate and Rhythm: Normal rate and regular rhythm.  Pulmonary:     Effort: Pulmonary effort is normal.     Breath sounds: Normal breath sounds.  Abdominal:     Palpations: Abdomen is soft.     Tenderness: There is no abdominal tenderness.  Neurological:     General: No focal deficit present.     Mental Status: She is alert and oriented to person, place, and time.    Assessment & Plan:   1. Gastroesophageal reflux disease, unspecified whether esophagitis present Although improved, will refer to GI for further eval/mgt - Ambulatory referral to Gastroenterology  Outpatient Encounter Medications as of 08/11/2021  Medication Sig   aspirin 81 MG chewable tablet Chew 81 mg by mouth daily as needed for moderate pain.   levothyroxine (SYNTHROID) 88 MCG tablet Take 1 tablet (88 mcg total) by mouth daily.  Multiple Vitamin (MULTIVITAMIN WITH MINERALS) TABS Take 1 tablet by mouth daily.   omeprazole (PRILOSEC) 40 MG capsule Take 1 capsule (40 mg total) by mouth daily.   sucralfate (CARAFATE) 1 g tablet Take 1 tablet (1 g total) by mouth 4 (four) times daily -  with meals and at bedtime.   No facility-administered encounter medications on file as of 08/11/2021.    Follow-up: No follow-ups on file.   Becky Sax, MD

## 2021-08-17 ENCOUNTER — Encounter: Payer: Self-pay | Admitting: Family Medicine

## 2021-08-17 ENCOUNTER — Other Ambulatory Visit: Payer: Self-pay

## 2021-08-17 ENCOUNTER — Encounter: Payer: Self-pay | Admitting: Family

## 2021-08-17 ENCOUNTER — Telehealth: Payer: Self-pay | Admitting: *Deleted

## 2021-08-17 ENCOUNTER — Ambulatory Visit (INDEPENDENT_AMBULATORY_CARE_PROVIDER_SITE_OTHER): Payer: 59 | Admitting: Family

## 2021-08-17 DIAGNOSIS — U071 COVID-19: Secondary | ICD-10-CM

## 2021-08-17 MED ORDER — NIRMATRELVIR/RITONAVIR (PAXLOVID)TABLET: 3.0000 | ORAL_TABLET | Freq: Two times a day (BID) | ORAL | 0 refills | Status: AC

## 2021-08-17 NOTE — Telephone Encounter (Signed)
Pt has completed appointment for COVID medication

## 2021-08-17 NOTE — Telephone Encounter (Signed)
Requesting medication for COVID.  Please call.

## 2021-08-17 NOTE — Progress Notes (Signed)
Pt presents for telemedicine visit tested positive for COVID, symptoms include runny nose, headache, fatigue.

## 2021-08-17 NOTE — Progress Notes (Signed)
Virtual Visit via Telephone Note  I connected with Sharon Moreno, on 08/17/2021 at 10:44 AM by telephone due to the COVID-19 pandemic and verified that I am speaking with the correct person using two identifiers.  Due to current restrictions/limitations of in-office visits due to the COVID-19 pandemic, this scheduled clinical appointment was converted to a telehealth visit.   Consent: I discussed the limitations, risks, security and privacy concerns of performing an evaluation and management service by telephone and the availability of in person appointments. I also discussed with the patient that there may be a patient responsible charge related to this service. The patient expressed understanding and agreed to proceed.   Location of Patient: Home  Location of Provider: [facility] [place] participating in Telemedicine visit: [place], NP [place] Else, [place]   History of Present Illness: Sharon Moreno is a 51 year-old female who presents for Covid positive.   Symptoms began 3 days ago. Took a home Covid test on yesterday and positive. Endorses runny nose, headache, fatigue, fever, sneeze, cough, ears stopped up, and scratchy throat. Denies chest pain, shortness of breath, and additional red flag symptoms.      Past Medical History:  Diagnosis Date   Hypothyroidism    Thyroid disease    No Known Allergies  Current Outpatient Medications on File Prior to Visit  Medication Sig Dispense Refill   aspirin 81 MG chewable tablet Chew 81 mg by mouth daily as needed for moderate pain.     levothyroxine (SYNTHROID) 88 MCG tablet Take 1 tablet (88 mcg total) by mouth daily. 45 tablet 3   Multiple Vitamin (MULTIVITAMIN WITH MINERALS) TABS Take 1 tablet by mouth daily.     omeprazole (PRILOSEC) 40 MG capsule Take 1 capsule (40 mg total) by mouth daily. 30 capsule 1   sucralfate (CARAFATE) 1 g tablet Take 1 tablet (1 g total) by mouth  4 (four) times daily -  with meals and at bedtime. 120 tablet 1   No current facility-administered medications on file prior to visit.    Observations/Objective: Alert and oriented x 3. Not in acute distress. Physical examination not completed as this is a telemedicine visit.  Assessment and Plan: 1. COVID: -  Nirmatrelvir Ritonavir as prescribed.  - Continue over-the-counter regimen as needed.  - Follow-up with primary provider as scheduled. - nirmatrelvir/ritonavir EUA (PAXLOVID) 20 x 150 MG & 10 x 100MG  TABS; Take 3 tablets by mouth 2 (two) times daily for 5 days. (Take nirmatrelvir 150 mg two tablets twice daily for 5 days and ritonavir 100 mg one tablet twice daily for 5 days)  Dispense: 30 tablet; Refill: 0   Follow Up Instructions: Follow-up with primary provider as scheduled.   Patient was given clear instructions to go to Emergency Department or return to medical center if symptoms don't improve, worsen, or new problems develop.The patient verbalized understanding.  I discussed the assessment and treatment plan with the patient. The patient was provided an opportunity to ask questions and all were answered. The patient agreed with the plan and demonstrated an understanding of the instructions.   The patient was advised to call back or seek an in-person evaluation if the symptoms worsen or if the condition fails to improve as anticipated.     I provided 8 minutes total of non-face-to-face time during this encounter.   Camillia Herter, NP  [facility] [place], [place] 08/17/2021,  10:44 AM

## 2021-11-11 ENCOUNTER — Other Ambulatory Visit: Payer: Self-pay | Admitting: Family Medicine

## 2021-11-11 ENCOUNTER — Ambulatory Visit
Admission: RE | Admit: 2021-11-11 | Discharge: 2021-11-11 | Disposition: A | Discharge status: Home / Self Care | Payer: 59 | Source: Ambulatory Visit | Attending: Family Medicine | Admitting: Family Medicine

## 2021-11-11 DIAGNOSIS — R0602 Shortness of breath: Secondary | ICD-10-CM

## 2021-11-21 ENCOUNTER — Encounter: Payer: Self-pay | Admitting: Internal Medicine

## 2021-12-09 ENCOUNTER — Ambulatory Visit: Payer: 59 | Admitting: Internal Medicine

## 2021-12-09 ENCOUNTER — Encounter: Payer: Self-pay | Admitting: Internal Medicine

## 2021-12-09 VITALS — BP 120/74 | HR 79 | Ht 61.0 in | Wt 154.4 lb

## 2021-12-09 DIAGNOSIS — C73 Malignant neoplasm of thyroid gland: Secondary | ICD-10-CM | POA: Diagnosis not present

## 2021-12-09 DIAGNOSIS — E890 Postprocedural hypothyroidism: Secondary | ICD-10-CM

## 2021-12-09 NOTE — Progress Notes (Signed)
She is patient ID: Sharon Moreno, female   DOB: 25-Nov-1970, 51 y.o.   MRN: [id number]   HPI  Sharon Moreno is a 51 y.o.-year-old female, initially referred by her PCP, Dr. Lujean Amel, returning for follow-up for papillary thyroid cancer and postsurgical hypothyroidism.  Patient was previously seen by Dr. Gilberto Better, in [phone], Dr. Dwyane Dee, last visit 5 years ago.  She also saw Dr. Awilda Metro from 2016-2018.  Last visit with me 6 months ago.  Interim history: No neck pressure.  She occasionally feels something stuck in the throat.  This is not new. No tremors, palpitations, heat intolerance. She stopped eating meat, started walking more, now also exercising.  She lost 6 pounds since last visit.  Papillary thyroid carcinoma She also had posttreatment whole-body scans-latest recombinant TSH stimulated whole-body scan was in 2017 and this was negative for metastasis or recurrence.  Reviewed and addended had thyroid cancer history: 1998: Thyroid cancer diagnosed by biopsy, after a thyroid nodule was observed by her mother.   10/1996: Total thyroidectomy: PTC, No further information is available about the pathology 10/1996: RAI tx with 100 mCi 2000: WBS: Negative 05/2001: RAI tx with 100 mCi 03/2002: RAI tx with 150 mCi 05/15/2003: Neck ultrasound: Ultrasound examination demonstrates two nodules in the left side of the neck deep to the anterior musculature where the left lobe of the thyroid gland previously resided. This is just medial to the jugular vein. One of these nodules is 5.5 mm in diameter and the other is just inferior and slightly deeper and measures 5.0 mm. I suspect these represent recurrence of tumor.   No other abnormality. No evidence of nodules seen to the right of the trachea.   IMPRESSION Two nodules to the left of the trachea at the site of the previous left lobe of the thyroid gland. This is worrisome for tumor recurrence. 2007: WBS: Negative 08/18/2015: rTSH stimulated  WBS: Negative  08/14/2016: Neck ultrasound: Parenchymal Echotexture: Surgically absent Isthmus: Surgically absent Right lobe: Surgically absent Left lobe: Surgically absent There is a 0.5 x 0.3 x 0.4 cm soft tissue nodule in the thyroidectomy bed just to the left of midline (previously 7 x 3 x 6 mm). IMPRESSION: 1. No interval enlargement of small nonspecific nodule presumably residual/recurrent in the left thyroidectomy bed. 04/25/2019: Neck ultrasound: The thyroid gland is surgically absent. Sonographic interrogation of the right and left resection beds demonstrates no evidence of residual or recurrent thyroid tissue or nodularity. There is no evidence of lymphadenopathy.   However, there is a circumscribed anechoic structure with posterior acoustic enhancement in the superior aspect of the left neck measuring 0.6 by 0.7 by 1.2 cm. This has not been previously identified.   In the midline, inferior to the prior thyroid isthmus there is a small hypoechoic solid nodule measuring 0.4 x 0.3 x 0.4 cm. This has slightly decreased in size compared to January of 2017 when the abnormality measured 0.7 x 0.3 x 0.6 cm.   IMPRESSION: 1. Slowly decreasing size of small nodule inferior to the prior thyroid isthmus compared to prior studies dating back to January of 2017. Involution over time suggests a benign process. 2. Nonspecific simple appearing cyst in the superior aspect of the right neck measures 1.2 x 0.6 x 0.7 cm and has not been previously identified. This is of uncertain etiology and significance. While this is likely benign, any new cystic lesion in the neck should be evaluated further to exclude the possibility of necrotic lymphadenopathy. Recommend contrast-enhanced CT  scan of the neck. 3. Surgical changes of total thyroidectomy. 05/22/2019: CT neck: 1. Left anterior superior parotid space 12 x 15 mm enhancing and slightly irregular round soft tissue nodule is most compatible  with a small primary parotid neoplasm. Recommend ENT referral. 2. No cystic mass or lymphadenopathy in the neck to correspond to the recent ultrasound. The largest lymph nodes are at the right level 2 station measuring 8-9 mm short axis, and remain normal by CT criteria. 3. Negative thyroid bed. 4. Ectatic distal aortic arch, about 30 mm diameter. Recommend annual imaging followup by Chest CTA or MRA. Patient was referred to ENT.  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 Also, I sent the results to PCP regarding the distal aortic arch dilation. Neck U/S (11/02/2020): Parenchymal Echotexture: None identified Isthmus: Surgically absent Right lobe: Surgically absent Left lobe: Surgically absent _________________________________________________________   0.4 cm hypoechoic nodule in the midline of the thyroidectomy bed, previously 0.4. 0.2 cm hypoechoic nodule in the left thyroidectomy bed, not described on prior study. 2 adjacent 0.3 cm hypoechoic nodules in the right thyroid lobectomy bed. _________________________________________________________   Right cervical lymph nodes measuring up to 0.8 cm short axis diameter.   IMPRESSION: 1. Small soft tissue foci in the thyroidectomy bed as above. 2. Single prominent right cervical lymph node 0.7 cm short axis diameter, nonspecific She has very small hypoechoic nodules in the thyroidectomy bed, too small to be biopsied.  Neck U/S (06/09/2021): Patient is status post total thyroidectomy. Stable 4 mm hypoechoic well-circumscribed nodule in the midline.  No other thyroid bed abnormality, nodule, residual thyroid tissue, or mass by ultrasound. No regional adenopathy.   IMPRESSION: Stable 4 mm isthmus surgical bed nodule.  No other significant finding by ultrasound. Lesion appears to be very stable, too small to biopsy, and it was not picked up on the CT scan or previous  whole-body scans.  We decided to continue to follow it without intervention.  Thyroglobulin levels remains detectable while ATA antibodies were negative: Lab Results  Component Value Date   THYROGLB 0.2 (L) 06/06/2021   THYROGLB 0.1 (L) 04/23/2020   THYROGLB 0.3 (L) 04/22/2019   THYROGLB 0.3 10/01/2013   Lab Results  Component Value Date   THGAB <1 06/06/2021   THGAB <1 04/23/2020   THGAB <1 04/22/2019   08/19/2014: Tg 0.5, ATA <0.1 05/05/2015: ATA <1.0  Pt denies: - feeling nodules in neck - hoarseness - dysphagia - choking  Postsurgical hypothyroidism:  Pt is on levothyroxine 88 mcg daily: - in am - fasting - at least 30 min from b'fast - no Ca, Fe, PPIs - +  MVI at night - not on Biotin On vitamin C.  Reviewed TFTs: 11/15/2021: TSH <0.01 -after I received his records from her PCP, I sent her a message to reduce the levothyroxine dose to 75 mcg daily but she did not read the message... Lab Results  Component Value Date   TSH 0.04 (L) 07/26/2021   TSH 0.09 (L) 06/06/2021   TSH 0.222 (L) 10/22/2020   TSH 0.21 (L) 04/23/2020   TSH 1.33 10/22/2019   TSH 3.04 04/22/2019   TSH 1.99 10/01/2013   TSH 1.21 01/28/2013   FREET4 1.30 07/26/2021   FREET4 1.39 06/06/2021   FREET4 1.75 10/22/2020   FREET4 1.4 04/23/2020   FREET4 1.16 10/22/2019   FREET4 0.95 04/22/2019   FREET4 1.01 10/01/2013   FREET4 1.02 01/28/2013  01/10/2019: TSH 0.119  11/05/2018: TSH 0.03 08/07/2018: TSH 6.01   She continues to have hot flashes.  No FH of thyroid disease or cancer. No h/o radiation tx to head or neck other than RAI treatment. No herbal supplements. + Biotin use now off. No recent steroids use.   ROS: + See HPI  I reviewed pt's medications, allergies, PMH, social hx, family hx, and changes were documented in the history of present illness. Otherwise, unchanged from my initial visit note.  Past Medical History:  Diagnosis Date   Hypothyroidism    Thyroid disease     Surgeries: -Please see HPI  Social History   Socioeconomic History   Marital status: Married    Spouse name: Not on file   Number of children: 2: 36 and 4 years old in 04/2019   Years of education: Not on file   Highest education level: Not on file  Occupational History    Caseworker for social services  Social Needs   Financial resource strain: Not on file   Food insecurity    Worry: Not on file    Inability: Not on file   Transportation needs    Medical: Not on file    Non-medical: Not on file  Tobacco Use   Smoking status: Never Smoker   Smokeless tobacco: Never Used  Substance and Sexual Activity   Alcohol use: No   Drug use: No   Current Outpatient Medications on File Prior to Visit  Medication Sig Dispense Refill   aspirin 81 MG chewable tablet Chew 81 mg by mouth daily as needed for moderate pain.     levothyroxine (SYNTHROID) 88 MCG tablet Take 1 tablet (88 mcg total) by mouth daily. 45 tablet 3   Multiple Vitamin (MULTIVITAMIN WITH MINERALS) TABS Take 1 tablet by mouth daily.     omeprazole (PRILOSEC) 40 MG capsule Take 1 capsule (40 mg total) by mouth daily. 30 capsule 1   sucralfate (CARAFATE) 1 g tablet Take 1 tablet (1 g total) by mouth 4 (four) times daily -  with meals and at bedtime. 120 tablet 1   No current facility-administered medications on file prior to visit.   No Known Allergies  Family history: -Diabetes in aunt and maternal grandfather  PE: BP 120/74 (BP Location: Right Arm, Patient Position: Sitting, Cuff Size: Normal)   Pulse 79   Ht '5\' 1"'  (1.549 m)   Wt 154 lb 6.4 oz (70 kg)   LMP 09/30/2013   SpO2 98%   BMI 29.17 kg/m  Wt Readings from Last 3 Encounters:  12/09/21 154 lb 6.4 oz (70 kg)  08/11/21 159 lb (72.1 kg)  06/23/21 161 lb (73 kg)   Constitutional: Slightly overweight, in NAD Eyes: EOMI, no exophthalmos ENT: moist mucous membranes, no neck masses palpable, no cervical lymphadenopathy Cardiovascular: RRR, No  MRG Respiratory: CTA B Musculoskeletal: no deformities Skin: moist, warm, no rashes Neurological: no tremor with outstretched hands, DTR normal in all 4  ASSESSMENT: 1. Thyroid cancer - see HPI  2. Postsurgical Hypothyroidism  PLAN:  1. Thyroid cancer - papillary -Her thyroglobulin levels remain detectable, but very low. -I do not have details about the tumor size or pathology, but she did have 3 RAI treatments in the past with persistently detectable thyroglobulin.  We discussed in the past that the main the role of this is to facilitate monitoring in the long run, by checking thyroglobulin.  If the thyroglobulin is detectable, there are most likely persistent cancer cells but these have not been  localized by previous imaging studies.  She did have a thyroid bed mass which involuted on the ultrasound from 04/2019.  We discussed that this is consistent with a benign finding.  We repeated a neck ultrasound in 10/2020 and this showed stability of the mass.  At that time, also noticed, were a 2 mm and 2 x 3 mm masses in the low right thyroid surgical bed respectively.   -Of note she also had an upper neck mass on the ultrasound from 2020 and I referred her to Dr. Lucia Gaskins with ENT.  She had left superior parathyroidectomy in 12/2019 that showed mucoepidermoid carcinoma. -At last visit, we obtained a neck ultrasound and this showed stability of the 4 mm thyroid nodule, while the rest of the nodules were not visible.  This nodule is too small to biopsy.  We will continue to follow this expectantly. -At last visit, thyroglobulin level was slightly higher.  We will repeat this today along with ATA antibodies ([facility] assay - due to lab pickup hours). - I will then see the patient in 6 months  2.  Patient with history of total thyroidectomy for thyroid cancer, now with iatrogenic hypothyroidism, on levothyroxine therapy, uncontrolled - latest thyroid labs reviewed with pt. >> TSH was undetectably low in  10/2021 - she continues on LT4 88 mcg daily (did not see the message to reduce the dose after the above results returned...) - pt feels good on this dose. - we discussed about taking the thyroid hormone every day, with water, >30 minutes before breakfast, separated by >4 hours from acid reflux medications, calcium, iron, multivitamins. Pt. is taking it correctly. - will check thyroid tests today: TSH and fT4 - If labs are abnormal, she will need to return for repeat TFTs in 1.5 months  She just refilled the LT4 88.   Office Visit on 12/09/2021  Component Date Value Ref Range Status   TSH 12/09/2021 0.017 (L)  0.450 - 4.500 uIU/mL Final   Free T4 12/09/2021 1.75  0.82 - 1.77 ng/dL Final   Thyroglobulin (TG-RIA) 12/09/2021 WILL FOLLOW   Preliminary   Thyroglobulin Antibody 12/09/2021 WILL FOLLOW   Preliminary   TSH is still suppressed >> will ask her to take the LT4 88 mcg 6/7 days and repeat the TFTs in 1.5 mo. Tg + ATA still pending.  Philemon Kingdom, MD PhD [facility] Endocr[facility]ology

## 2021-12-09 NOTE — Patient Instructions (Addendum)
Please stop at the lab.  Please continue Levothyroxine 88 mcg daily.  Take the thyroid hormone every day, with water, at least 30 minutes before breakfast, separated by at least 4 hours from: - acid reflux medications - calcium - iron - multivitamins  Please come back for a follow-up appointment in 6 months. 

## 2021-12-15 LAB — T4, FREE: Free T4: 1.75 ng/dL (ref 0.82–1.77)

## 2021-12-15 LAB — THYROGLOBULIN ANTIBODY: Thyroglobulin Antibody: <1 IU/mL (ref 0.0–0.9)

## 2021-12-15 LAB — TSH: TSH: 0.017 u[IU]/mL — ABNORMAL LOW (ref 0.450–4.500)

## 2021-12-15 LAB — THYROGLOBULIN LEVEL: Thyroglobulin (TG-RIA): <2 ng/mL

## 2022-03-30 ENCOUNTER — Ambulatory Visit: Payer: 59

## 2022-06-12 ENCOUNTER — Encounter: Payer: Self-pay | Admitting: Internal Medicine

## 2022-06-12 ENCOUNTER — Ambulatory Visit: Payer: 59 | Admitting: Internal Medicine

## 2022-06-12 VITALS — BP 120/80 | HR 70 | Ht 61.0 in | Wt 153.2 lb

## 2022-06-12 DIAGNOSIS — Z833 Family history of diabetes mellitus: Secondary | ICD-10-CM

## 2022-06-12 DIAGNOSIS — E890 Postprocedural hypothyroidism: Secondary | ICD-10-CM | POA: Diagnosis not present

## 2022-06-12 DIAGNOSIS — C73 Malignant neoplasm of thyroid gland: Secondary | ICD-10-CM

## 2022-06-12 NOTE — Patient Instructions (Addendum)
Please stop at the lab.  Please continue Levothyroxine 88 mcg daily.  Take the thyroid hormone every day, with water, at least 30 minutes before breakfast, separated by at least 4 hours from: - acid reflux medications - calcium - iron - multivitamins  Please come back for a follow-up appointment in 6 months.

## 2022-06-12 NOTE — Progress Notes (Signed)
She is patient ID: Sharon Moreno, female   DOB: Jul 03, 1971, 51 y.o.   MRN: [id number]   HPI  Sharon Moreno is a 51 y.o.-year-old female, initially referred by her PCP, Dr. Darrow Bussing, returning for follow-up for papillary thyroid cancer and postsurgical hypothyroidism.  Patient was previously seen by Dr. Cecilie Lowers, in [phone], Dr. Lucianne Muss.  She also saw Dr. Karen Kitchens from 2016-2018.  Last visit with me 6 months ago.  Interim history: Before last visit, she stopped eating meat, started walking more, and also exercising.  She lost 6 pounds before last visit.  Since then, she reintroduced meat, but only occasionally and continues to exercise.  She maintained her weight loss. Otherwise, she is feeling well, without complaints today.  Papillary thyroid carcinoma She also had posttreatment whole-body scans-latest recombinant TSH stimulated whole-body scan was in 2017 and this was negative for metastasis or recurrence.  Reviewed and addended had thyroid cancer history: 1998: Thyroid cancer diagnosed by biopsy, after a thyroid nodule was observed by her mother.   10/1996: Total thyroidectomy: PTC, No further information is available about the pathology 10/1996: RAI tx with 100 mCi 2000: WBS: Negative 05/2001: RAI tx with 100 mCi 03/2002: RAI tx with 150 mCi 05/15/2003: Neck ultrasound: Ultrasound examination demonstrates two nodules in the left side of the neck deep to the anterior musculature where the left lobe of the thyroid gland previously resided. This is just medial to the jugular vein. One of these nodules is 5.5 mm in diameter and the other is just inferior and slightly deeper and measures 5.0 mm. I suspect these represent recurrence of tumor.   No other abnormality. No evidence of nodules seen to the right of the trachea.   IMPRESSION Two nodules to the left of the trachea at the site of the previous left lobe of the thyroid gland. This is worrisome for tumor recurrence. 2007: WBS:  Negative 08/18/2015: rTSH stimulated WBS: Negative  08/14/2016: Neck ultrasound: Parenchymal Echotexture: Surgically absent Isthmus: Surgically absent Right lobe: Surgically absent Left lobe: Surgically absent There is a 0.5 x 0.3 x 0.4 cm soft tissue nodule in the thyroidectomy bed just to the left of midline (previously 7 x 3 x 6 mm). IMPRESSION: 1. No interval enlargement of small nonspecific nodule presumably residual/recurrent in the left thyroidectomy bed. 04/25/2019: Neck ultrasound: The thyroid gland is surgically absent. Sonographic interrogation of the right and left resection beds demonstrates no evidence of residual or recurrent thyroid tissue or nodularity. There is no evidence of lymphadenopathy.   However, there is a circumscribed anechoic structure with posterior acoustic enhancement in the superior aspect of the left neck measuring 0.6 by 0.7 by 1.2 cm. This has not been previously identified.   In the midline, inferior to the prior thyroid isthmus there is a small hypoechoic solid nodule measuring 0.4 x 0.3 x 0.4 cm. This has slightly decreased in size compared to January of 2017 when the abnormality measured 0.7 x 0.3 x 0.6 cm.   IMPRESSION: 1. Slowly decreasing size of small nodule inferior to the prior thyroid isthmus compared to prior studies dating back to January of 2017. Involution over time suggests a benign process. 2. Nonspecific simple appearing cyst in the superior aspect of the right neck measures 1.2 x 0.6 x 0.7 cm and has not been previously identified. This is of uncertain etiology and significance. While this is likely benign, any new cystic lesion in the neck should be evaluated further to exclude the possibility of necrotic lymphadenopathy.  Recommend contrast-enhanced CT scan of the neck. 3. Surgical changes of total thyroidectomy. 05/22/2019: CT neck: 1. Left anterior superior parotid space 12 x 15 mm enhancing and slightly irregular round  soft tissue nodule is most compatible with a small primary parotid neoplasm. Recommend ENT referral. 2. No cystic mass or lymphadenopathy in the neck to correspond to the recent ultrasound. The largest lymph nodes are at the right level 2 station measuring 8-9 mm short axis, and remain normal by CT criteria. 3. Negative thyroid bed. 4. Ectatic distal aortic arch, about 30 mm diameter. Recommend annual imaging followup by Chest CTA or MRA. Patient was referred to ENT.  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 Also, I sent the results to PCP regarding the distal aortic arch dilation. Neck U/S (11/02/2020): Parenchymal Echotexture: None identified Isthmus: Surgically absent Right lobe: Surgically absent Left lobe: Surgically absent _________________________________________________________   0.4 cm hypoechoic nodule in the midline of the thyroidectomy bed, previously 0.4. 0.2 cm hypoechoic nodule in the left thyroidectomy bed, not described on prior study. 2 adjacent 0.3 cm hypoechoic nodules in the right thyroid lobectomy bed. _________________________________________________________   Right cervical lymph nodes measuring up to 0.8 cm short axis diameter.   IMPRESSION: 1. Small soft tissue foci in the thyroidectomy bed as above. 2. Single prominent right cervical lymph node 0.7 cm short axis diameter, nonspecific She has very small hypoechoic nodules in the thyroidectomy bed, too small to be biopsied.  Neck U/S (06/09/2021): Patient is status post total thyroidectomy. Stable 4 mm hypoechoic well-circumscribed nodule in the midline.  No other thyroid bed abnormality, nodule, residual thyroid tissue, or mass by ultrasound. No regional adenopathy.   IMPRESSION: Stable 4 mm isthmus surgical bed nodule.  No other significant finding by ultrasound. Lesion appears to be very stable, too small to biopsy, and it was not  picked up on the CT scan or previous whole-body scans.  We decided to continue to follow it without intervention.  Thyroglobulin levels remains detectable while ATA antibodies were negative: Lab Results  Component Value Date   THYROGLB <2.0 12/09/2021   THYROGLB 0.2 (L) 06/06/2021   THYROGLB 0.1 (L) 04/23/2020   THYROGLB 0.3 (L) 04/22/2019   THYROGLB 0.3 10/01/2013   Lab Results  Component Value Date   THGAB <1.0 12/09/2021   THGAB <1 06/06/2021   THGAB <1 04/23/2020   THGAB <1 04/22/2019   08/19/2014: Tg 0.5, ATA <0.1 05/05/2015: ATA <1.0  Pt denies: - feeling nodules in neck - hoarseness - dysphagia - choking  Postsurgical hypothyroidism:  Pt is on levothyroxine 88 mcg daily -she did not remember reading the message to decrease it to 6 out of 7 days: - in am - fasting - at least 30 min from b'fast - no Ca, Fe, PPIs - no  MVI anymore - not on Biotin On vitamin C and D.  Reviewed TFTs -she did not return for labs after the above dose change: Lab Results  Component Value Date   TSH 0.017 (L) 12/09/2021   TSH 0.04 (L) 07/26/2021   TSH 0.09 (L) 06/06/2021   TSH 0.222 (L) 10/22/2020   TSH 0.21 (L) 04/23/2020   TSH 1.33 10/22/2019   TSH 3.04 04/22/2019   TSH 1.99 10/01/2013   TSH 1.21 01/28/2013   FREET4 1.75 12/09/2021   FREET4 1.30 07/26/2021   FREET4 1.39 06/06/2021   FREET4 1.75 10/22/2020   FREET4 1.4 04/23/2020   FREET4 1.16  10/22/2019   FREET4 0.95 04/22/2019   FREET4 1.01 10/01/2013   FREET4 1.02 01/28/2013  11/15/2021: TSH <0.01 -after I received his records from her PCP, I sent her a message to reduce the levothyroxine dose to 75 mcg daily but she did not read the message... 01/10/2019: TSH 0.119 11/05/2018: TSH 0.03 08/07/2018: TSH 6.01   She continues to have hot flashes.  No FH of thyroid disease or cancer. No h/o radiation tx to head or neck other than RAI treatment. No herbal supplements. No recent steroids use.   ROS: + See HPI  I  reviewed pt's medications, allergies, PMH, social hx, family hx, and changes were documented in the history of present illness. Otherwise, unchanged from my initial visit note.  Past Medical History:  Diagnosis Date   Hypothyroidism    Thyroid disease    Surgeries: -Please see HPI  Social History   Socioeconomic History   Marital status: Married    Spouse name: Not on file   Number of children: 2: 70 and 31 years old in 04/2019   Years of education: Not on file   Highest education level: Not on file  Occupational History    Caseworker for social services  Social Needs   Financial resource strain: Not on file   Food insecurity    Worry: Not on file    Inability: Not on file   Transportation needs    Medical: Not on file    Non-medical: Not on file  Tobacco Use   Smoking status: Never Smoker   Smokeless tobacco: Never Used  Substance and Sexual Activity   Alcohol use: No   Drug use: No   Current Outpatient Medications on File Prior to Visit  Medication Sig Dispense Refill   aspirin 81 MG chewable tablet Chew 81 mg by mouth daily as needed for moderate pain.     levothyroxine (SYNTHROID) 88 MCG tablet Take 1 tablet (88 mcg total) by mouth daily. 45 tablet 3   Multiple Vitamin (MULTIVITAMIN WITH MINERALS) TABS Take 1 tablet by mouth daily.     sucralfate (CARAFATE) 1 g tablet Take 1 tablet (1 g total) by mouth 4 (four) times daily -  with meals and at bedtime. 120 tablet 1   No current facility-administered medications on file prior to visit.   No Known Allergies  Family history: -Diabetes in aunt and maternal grandfather  PE: BP 120/80 (BP Location: Right Arm, Patient Position: Sitting, Cuff Size: Normal)   Pulse 70   Ht 5\' 1"  (1.549 m)   Wt 153 lb 3.2 oz (69.5 kg)   LMP 09/30/2013   SpO2 99%   BMI 28.95 kg/m  Wt Readings from Last 3 Encounters:  06/12/22 153 lb 3.2 oz (69.5 kg)  12/09/21 154 lb 6.4 oz (70 kg)  08/11/21 159 lb (72.1 kg)   Constitutional:  Slightly overweight, in NAD Eyes: EOMI, no exophthalmos ENT: no neck masses palpable, no cervical lymphadenopathy Cardiovascular: RRR, No MRG Respiratory: CTA B Musculoskeletal: no deformities Skin: no rashes Neurological: no tremor with outstretched hands, DTR normal in all 4  ASSESSMENT: 1. Thyroid cancer - see HPI  2. Postsurgical Hypothyroidism  PLAN:  1. Thyroid cancer - papillary -I do not have details about the original tumor size or pathology, but she did have 3 RAI treatments in the past with persistently detectable thyroglobulin.  We discussed in the past that the main the role of this is to facilitate monitoring in the long run, by checking thyroglobulin.  If the thyroglobulin is detectable, there are most likely persistent cancer cells but these have not been localized by previous imaging studies.  She did have a thyroid bed mass, which involuted on the ultrasound from 04/2019.  We discussed that this is consistent with a benign finding.  We repeated a neck ultrasound in 10/2020 and this showed stability of the mass.  At that time, also noticed, were 2 mm and a 2 x 3 mm mass in the low right thyroid surgical bed. -Of note she also had an upper neck mass on the ultrasound from 2020 and I referred her to Dr. Ezzard Standing with ENT.  She had left superior parathyroidectomy in 12/2019 that showed mucoepidermoid carcinoma. -At the end of last year, we obtained a neck ultrasound and this showed stability of the 4 mm thyroid nodule, while the rest of the nodules were not visible.  This nodule was too small to biopsy.  We are continuing to follow this expectantly. -At the previous visit, thyroglobulin level was slightly higher.  We repeated this at last visit with a [facility] assay and the thyroglobulin was undetectable, as were her antibodies -Plan to repeat the thyroglobulin and the antibodies with the [facility] assay at next visit - I will then see the patient in 6 months.  2.  Patient with  history of total thyroidectomy for thyroid cancer, now with iatrogenic hypothyroidism, on levothyroxine therapy - latest thyroid labs reviewed with pt. >> normal: Lab Results  Component Value Date   TSH 0.017 (L) 12/09/2021  - she continues on LT4 88 mcg daily, as she did not remember my advised to decrease it to 6/7 days in 12/2021.  At this visit, we discussed about the importance of checking MyChart messages but she does appear to have read  this message, only she forgot about it.  I inactivated her MyChart today and sent her a code to activated again on her phone so that she can activate the option of receiving text messages about new activity and MyChart - I did explained that such a low TSH is dangerous and he can increase the risk of arrhythmias, stroke, heart attacks - we discussed about taking the thyroid hormone every day, with water, >30 minutes before breakfast, separated by >4 hours from acid reflux medications, calcium, iron, multivitamins. Pt. is taking it correctly. - will check thyroid tests today: TSH and fT4 - If labs are abnormal, she will need to return for repeat TFTs in 1.5 months  Component     Latest Ref Rng 06/12/2022  T4,Free(Direct)     0.60 - 1.60 ng/dL 5.10   TSH     2.58 - 5.27 uIU/mL 1.29   Hemoglobin A1C     4.6 - 6.5 % 5.6    Thyroid tests and HbA1c are normal.  We can continue with 88 mcg levothyroxine daily.  Carlus Pavlov, MD PhD [facility] Endocrinology

## 2022-06-13 LAB — T4, FREE: Free T4: 0.98 ng/dL (ref 0.60–1.60)

## 2022-06-13 LAB — TSH: TSH: 1.29 u[IU]/mL (ref 0.35–5.50)

## 2022-06-13 LAB — HEMOGLOBIN A1C: Hgb A1c MFr Bld: 5.6 % (ref 4.6–6.5)

## 2022-07-13 DIAGNOSIS — N6320 Unspecified lump in the left breast, unspecified quadrant: Secondary | ICD-10-CM | POA: Insufficient documentation

## 2022-08-01 ENCOUNTER — Other Ambulatory Visit: Payer: Self-pay | Admitting: Internal Medicine

## 2022-09-18 ENCOUNTER — Ambulatory Visit: Payer: 59 | Admitting: Family Medicine

## 2022-09-20 ENCOUNTER — Ambulatory Visit: Payer: 59 | Admitting: Family Medicine

## 2022-09-20 VITALS — BP 113/75 | HR 56 | Temp 97.9°F | Resp 16 | Wt 155.4 lb

## 2022-09-20 DIAGNOSIS — J209 Acute bronchitis, unspecified: Secondary | ICD-10-CM

## 2022-09-20 NOTE — Progress Notes (Signed)
Established Patient Office Visit  Subjective    Patient ID: Sharon Moreno, female    DOB: 02-18-1971  Age: 52 y.o. MRN: [id number]  CC:  Chief Complaint  Patient presents with   Wheezing    -    HPI Sharon Moreno presents for complaint of wheezing. She reports that several days ago developed wheezing and coughing. Then developed fever. Went to UC. Was prescribed prednisone, amox, and cough syrup. She has not finished the courses.    Outpatient Encounter Medications as of 09/20/2022  Medication Sig   amoxicillin-clavulanate (AUGMENTIN) 875-125 MG tablet Take by mouth.   aspirin 81 MG chewable tablet Chew 81 mg by mouth daily as needed for moderate pain.   benzonatate (TESSALON) 100 MG capsule TAKE 1 TO 2 CAPSULES BY MOUTH THREE TIMES DAILY AS NEEDED FOR COUGH . DO NOT EXCEED 6 PER 24 HOURS   brompheniramine-pseudoephedrine-DM 30-2-10 MG/5ML syrup Take by mouth.   fluticasone (FLONASE) 50 MCG/ACT nasal spray Place into the nose.   levothyroxine (SYNTHROID) 100 MCG tablet Take 1 tablet by mouth daily.   levothyroxine (SYNTHROID) 88 MCG tablet Take 1 tablet by mouth once daily   Multiple Vitamin (MULTIVITAMIN WITH MINERALS) TABS Take 1 tablet by mouth daily.   omeprazole (PRILOSEC) 40 MG capsule Take 1 capsule by mouth daily.   orphenadrine (NORFLEX) 100 MG tablet Take by mouth.   prednisoLONE acetate (PRED FORTE) 1 % ophthalmic suspension 1 drop 3 (three) times daily.   predniSONE (DELTASONE) 20 MG tablet Take two tablets PO once daily with food for five days   sucralfate (CARAFATE) 1 g tablet Take 1 tablet (1 g total) by mouth 4 (four) times daily -  with meals and at bedtime.   No facility-administered encounter medications on file as of 09/20/2022.    Past Medical History:  Diagnosis Date   Hypothyroidism    Thyroid disease     Past Surgical History:  Procedure Laterality Date   CESAREAN SECTION     PAROTIDECTOMY Left 12/19/2019   Procedure: LEFT PAROTIDECTOMY WITH  LEFT FACIAL DISSECTION;  Surgeon: Rozetta Nunnery, MD;  Location: [facility];  Service: ENT;  Laterality: Left;   THYROIDECTOMY     TONSILLECTOMY      Family History  Problem Relation Age of Onset   Diabetes Maternal Aunt     Social History   Socioeconomic History   Marital status: Single    Spouse name: Not on file   Number of children: Not on file   Years of education: Not on file   Highest education level: Not on file  Occupational History   Not on file  Tobacco Use   Smoking status: Never   Smokeless tobacco: Never  Vaping Use   Vaping Use: Never used  Substance and Sexual Activity   Alcohol use: No   Drug use: No   Sexual activity: Not Currently    Birth control/protection: Post-menopausal  Other Topics Concern   Not on file  Social History Narrative   Not on file   Social Determinants of Health   Financial Resource Strain: Not on file  Food Insecurity: Not on file  Transportation Needs: Not on file  Physical Activity: Not on file  Stress: Not on file  Social Connections: Not on file  Intimate Partner Violence: Not on file    Review of Systems  Constitutional:  Positive for fever.  Respiratory:  Positive for cough and wheezing.   All other systems reviewed  and are negative.       Objective    BP 113/75   Pulse (!) 56   Temp 97.9 F (36.6 C) (Oral)   Resp 16   Wt 155 lb 6.4 oz (70.5 kg)   LMP 09/30/2013   SpO2 96%   BMI 29.36 kg/m   Physical Exam Vitals and nursing note reviewed.  Constitutional:      General: She is not in acute distress. Cardiovascular:     Rate and Rhythm: Normal rate and regular rhythm.  Pulmonary:     Effort: Pulmonary effort is normal.     Breath sounds: Normal breath sounds. No wheezing.  Abdominal:     Palpations: Abdomen is soft.     Tenderness: There is no abdominal tenderness.  Neurological:     General: No focal deficit present.     Mental Status: She is alert and oriented to  person, place, and time.         Assessment & Plan:   1. Acute bronchitis, unspecified organism Improving. Complete course of abx as recommended.    Return if symptoms worsen or fail to improve.   Becky Sax, MD

## 2022-09-26 ENCOUNTER — Encounter: Payer: Self-pay | Admitting: Family Medicine

## 2022-10-25 ENCOUNTER — Ambulatory Visit: Payer: 59 | Admitting: Family Medicine

## 2022-10-25 ENCOUNTER — Encounter: Payer: Self-pay | Admitting: Family Medicine

## 2022-10-25 DIAGNOSIS — R519 Headache, unspecified: Secondary | ICD-10-CM | POA: Diagnosis not present

## 2022-10-25 DIAGNOSIS — H93239 Hyperacusis, unspecified ear: Secondary | ICD-10-CM

## 2022-10-25 DIAGNOSIS — S058X1A Other injuries of right eye and orbit, initial encounter: Secondary | ICD-10-CM | POA: Diagnosis not present
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
## 2022-10-30 ENCOUNTER — Encounter: Payer: Self-pay | Admitting: Family Medicine

## 2022-10-31 ENCOUNTER — Encounter: Payer: Self-pay | Admitting: Family Medicine

## 2022-10-31 ENCOUNTER — Ambulatory Visit: Payer: 59 | Admitting: Family Medicine

## 2022-11-10 NOTE — Telephone Encounter (Signed)
Please address concern.

## 2022-11-14 ENCOUNTER — Other Ambulatory Visit: Payer: Self-pay | Admitting: Family Medicine

## 2022-11-14 DIAGNOSIS — R519 Headache, unspecified: Secondary | ICD-10-CM

## 2022-12-21 ENCOUNTER — Ambulatory Visit: Payer: 59 | Admitting: Internal Medicine

## 2022-12-21 ENCOUNTER — Encounter: Payer: Self-pay | Admitting: Internal Medicine

## 2022-12-21 VITALS — BP 112/70 | HR 60 | Ht 61.0 in | Wt 151.0 lb

## 2022-12-21 DIAGNOSIS — E890 Postprocedural hypothyroidism: Secondary | ICD-10-CM | POA: Diagnosis not present

## 2022-12-21 DIAGNOSIS — C73 Malignant neoplasm of thyroid gland: Secondary | ICD-10-CM
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
## 2022-12-21 NOTE — Patient Instructions (Signed)
Please stop at the lab.  Please continue Levothyroxine 88 mcg daily.  Take the thyroid hormone every day, with water, at least 30 minutes before breakfast, separated by at least 4 hours from: - acid reflux medications - calcium - iron - multivitamins  Please come back for a follow-up appointment in 1 year. 

## 2022-12-22 LAB — T4, FREE: Free T4: 1.11 ng/dL (ref 0.60–1.60)

## 2022-12-22 LAB — TSH: TSH: 0.98 u[IU]/mL (ref 0.35–5.50)

## 2022-12-23 LAB — TGAB+THYROGLOBULIN IMA OR LCMS: Thyroglobulin Antibody: <1 IU/mL (ref 0.0–0.9)

## 2022-12-23 LAB — THYROGLOBULIN BY IMA: Thyroglobulin by IMA: 0.2 ng/mL — ABNORMAL LOW (ref 1.5–38.5)

## 2022-12-26 ENCOUNTER — Encounter: Payer: Self-pay | Admitting: Internal Medicine

## 2023-01-02 ENCOUNTER — Ambulatory Visit
Admission: RE | Admit: 2023-01-02 | Discharge: 2023-01-02 | Disposition: A | Discharge status: Home / Self Care | Payer: 59 | Source: Ambulatory Visit | Attending: Internal Medicine | Admitting: Internal Medicine

## 2023-01-02 DIAGNOSIS — L821 Other seborrheic keratosis: Secondary | ICD-10-CM | POA: Diagnosis not present

## 2023-01-02 DIAGNOSIS — C73 Malignant neoplasm of thyroid gland: Secondary | ICD-10-CM

## 2023-01-02 DIAGNOSIS — E041 Nontoxic single thyroid nodule: Secondary | ICD-10-CM | POA: Diagnosis not present

## 2023-01-02 DIAGNOSIS — L853 Xerosis cutis: Secondary | ICD-10-CM | POA: Diagnosis not present

## 2023-01-02 DIAGNOSIS — D2262 Melanocytic nevi of left upper limb, including shoulder: Secondary | ICD-10-CM | POA: Diagnosis not present

## 2023-01-17 ENCOUNTER — Ambulatory Visit: Payer: 59 | Admitting: Psychiatry

## 2023-01-17 ENCOUNTER — Telehealth: Payer: Self-pay | Admitting: Psychiatry

## 2023-01-17 ENCOUNTER — Encounter: Payer: Self-pay | Admitting: Psychiatry

## 2023-01-17 VITALS — BP 116/80 | HR 63 | Ht 61.0 in | Wt 152.5 lb

## 2023-01-17 DIAGNOSIS — H539 Unspecified visual disturbance: Secondary | ICD-10-CM | POA: Diagnosis not present

## 2023-01-17 DIAGNOSIS — G44329 Chronic post-traumatic headache, not intractable: Secondary | ICD-10-CM | POA: Diagnosis not present

## 2023-01-17 MED ORDER — LORAZEPAM 0.5 MG PO TABS: ORAL_TABLET | ORAL | 0 refills | Status: DC
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
## 2023-01-17 NOTE — Telephone Encounter (Signed)
sent to GI they obtain [employer] auth [phone] 

## 2023-01-17 NOTE — Patient Instructions (Signed)
Start magnesium oxide 500 mg daily for headaches and vision changes

## 2023-01-31 ENCOUNTER — Ambulatory Visit: Payer: 59 | Admitting: Psychiatry

## 2023-02-01 ENCOUNTER — Other Ambulatory Visit: Payer: 59

## 2023-02-07 ENCOUNTER — Encounter: Payer: Self-pay | Admitting: Psychiatry

## 2023-02-12 ENCOUNTER — Encounter: Payer: Self-pay | Admitting: Psychiatry

## 2023-02-14 ENCOUNTER — Other Ambulatory Visit: Payer: 59

## 2023-02-14 ENCOUNTER — Ambulatory Visit
Admission: RE | Admit: 2023-02-14 | Discharge: 2023-02-14 | Disposition: A | Discharge status: Home / Self Care | Payer: 59 | Source: Ambulatory Visit | Attending: Psychiatry | Admitting: Psychiatry

## 2023-02-14 DIAGNOSIS — H539 Unspecified visual disturbance: Secondary | ICD-10-CM

## 2023-02-14 MED ORDER — GADOPICLENOL 0.5 MMOL/ML IV SOLN
7.5000 mL | Freq: Once | INTRAVENOUS | Status: AC | PRN
  Administered 2023-02-14: 7.5 mL via INTRAVENOUS

## 2023-03-22 ENCOUNTER — Other Ambulatory Visit: Payer: Self-pay | Admitting: Internal Medicine

## 2023-04-19 DIAGNOSIS — R030 Elevated blood-pressure reading, without diagnosis of hypertension: Secondary | ICD-10-CM | POA: Diagnosis not present

## 2023-04-19 DIAGNOSIS — Z79890 Hormone replacement therapy (postmenopausal): Secondary | ICD-10-CM | POA: Diagnosis not present

## 2023-04-19 DIAGNOSIS — E890 Postprocedural hypothyroidism: Secondary | ICD-10-CM | POA: Diagnosis not present

## 2023-04-19 DIAGNOSIS — Z85850 Personal history of malignant neoplasm of thyroid: Secondary | ICD-10-CM | POA: Diagnosis not present
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
## 2023-05-15 ENCOUNTER — Encounter: Payer: Self-pay | Admitting: Adult Health

## 2023-05-15 ENCOUNTER — Ambulatory Visit (INDEPENDENT_AMBULATORY_CARE_PROVIDER_SITE_OTHER): Payer: 59 | Admitting: Adult Health

## 2023-05-15 VITALS — BP 117/80 | HR 66 | Ht 61.0 in | Wt 152.0 lb

## 2023-05-15 DIAGNOSIS — H539 Unspecified visual disturbance: Secondary | ICD-10-CM

## 2023-05-15 DIAGNOSIS — G44329 Chronic post-traumatic headache, not intractable: Secondary | ICD-10-CM | POA: Diagnosis not present

## 2023-05-15 NOTE — Patient Instructions (Addendum)
Your Plan:  Schedule follow up visit with ophthalmology  Spyris, Jolee Ewing, MD  [address]  [place], [place] [address]  [phone] (Work)   Continue to monitor headaches - if these should worsen, please let me know     Can follow up as needed at this time. Please call with any questions or concerns      Thank you for coming to see [place] at [facility]. I hope we have been able to provide you high quality care today.  You may receive a patient satisfaction survey over the next few weeks. We would appreciate your feedback and comments so that we may continue to improve ourselves and the health of our patients.    Concussion, Adult  A concussion is a brain injury from a hard, direct hit (trauma) to the head or body. This direct hit causes the brain to shake quickly back and forth inside the skull. This can damage brain cells and cause chemical changes in the brain. A concussion may also be known as a mild traumatic brain injury (TBI). The effects of a concussion can be serious. If you have a concussion, you should be very careful to avoid having a second concussion. What are the causes? This condition is caused by: A direct hit to your head. Sudden movement of your body that causes your brain to move back and forth inside the skull, such as in a car crash. What are the signs or symptoms? The signs of a concussion can be hard to notice. Early on, they may be missed by you, family members, and health care providers. You may look fine on the outside but may act or feel differently. Every head injury is different. Symptoms are usually temporary but may last for days, weeks, or even months. Some symptoms appear right away, but other symptoms may not show up for hours or days. Physical symptoms Headaches. Dizziness and problems with coordination or balance. Sensitivity to light or noise. Nausea or vomiting. Tiredness (fatigue). Vision or hearing  problems. Seizure. Mental and emotional symptoms Irritability or mood changes. Memory problems. Trouble concentrating, organizing, or making decisions. Changes in eating or sleeping patterns. Slowness in thinking, acting or reacting, speaking, or reading. Anxiety or depression. How is this diagnosed? This condition is diagnosed based on your symptoms and injury. You may also have tests, including: Imaging tests, such as a CT scan or an MRI. Neuropsychological tests. These measure your thinking, understanding, learning, and memory. How is this treated? Treatment for this condition includes: Stopping sports or activity if you are injured. Physical and mental rest and careful observation, usually at home. Medicines to help with symptoms such as headaches, nausea, or difficulty sleeping. Referral to a concussion clinic or rehab center. Follow these instructions at home: Activity Limit activities that require a lot of thought or concentration, such as: Doing homework or job-related work. Watching TV. Using the computer or phone. Playing memory games and doing puzzles. Rest helps your brain heal. Make sure you: Get plenty of sleep. Most adults should get 7-9 hours of sleep each night. Rest during the day. Take naps or rest breaks when you feel tired. Avoid high-intensity exercise or physical activities that take a lot of effort. Stop any activity that worsens symptoms. Your health care provider may recommend light exercise such as walking. Do not do high-risk activities that could cause a second concussion, such as riding a bike or playing sports. Ask your health care provider when you can return  to your normal activities, such as school, work, sports, and driving. Your ability to react may be slower after a brain injury. Never do these activities if you are dizzy. General instructions  Take over-the-counter and prescription medicines only as told by your health care provider. Some  medicines, such as blood thinners (anticoagulants) and aspirin, may increase the risk for complications, such as bleeding. Avoid taking opioid pain medicine while recovering from a concussion. Do not drink alcohol until your health care provider says you can. Drinking alcohol may slow your recovery and can put you at risk of further injury. Watch your symptoms and tell others around you to do the same. Complications sometimes occur after a concussion. Tell your work Production designer, theatre/television/film, teachers, Tax adviser, school counselor, coach, or sports trainer about your injury, symptoms, and restrictions. See a mental health therapist if you feel anxious or depressed. Managing this condition can be challenging. Keep all follow-up visits. Your health care provider will check on your recovery and give you a plan for returning to activities. How is this prevented? Avoiding another brain injury is very important. In rare cases, another injury can lead to permanent brain damage, brain swelling, or death. The risk of this is greatest during the first 7-10 days after a head injury. Avoid injuries by: Stopping activities that could lead to a second concussion, such as contact or recreational sports, until your health care provider says it is okay. Taking these actions once you have returned to sports or activities: Avoid plays or moves that can cause you to crash into another person. This is how most concussions occur. Follow the rules and be respectful of other players. Do not engage in violent or illegal plays. Getting regular exercise that includes strength and balance training. Wearing a properly fitting helmet during sports, biking, or other activities. Helmets can help protect you from serious skull and brain injuries, but they may not protect you from a concussion. Even when wearing a helmet, you should avoid being hit in the head. Where to find more information Centers for Disease Control and Prevention:  [employer] Contact a health care provider if: Your symptoms do not improve or get worse. You have new symptoms. You have another injury. Your coordination gets worse. You have unusual behavior changes. Get help right away if: You have a severe or worsening headache. You have weakness or numbness in any part of your body, slurred speech, vision changes, or confusion. You vomit repeatedly. You lose consciousness, are sleepier than normal, or are difficult to wake up. You have a seizure. These symptoms may be an emergency. Get help right away. Call 911. Do not wait to see if the symptoms will go away. Do not drive yourself to the hospital. Also, get help right away if: You have thoughts of hurting yourself or others. Take one of these steps if you feel like you may hurt yourself or others, or have thoughts about taking your own life: Go to your nearest emergency room. Call 911. Call the National Suicide Prevention Lifeline at 3344324954 or 988. This is open 24 hours a day. Text the Crisis Text Line at 9417369932. This information is not intended to replace advice given to you by your health care provider. Make sure you discuss any questions you have with your health care provider. Document Revised: 11/11/2021 Document Reviewed: 11/11/2021 Elsevier Patient Education  2024 [employer].

## 2023-05-29 DIAGNOSIS — E7800 Pure hypercholesterolemia, unspecified: Secondary | ICD-10-CM | POA: Diagnosis not present

## 2023-05-29 DIAGNOSIS — Z79899 Other long term (current) drug therapy: Secondary | ICD-10-CM | POA: Diagnosis not present

## 2023-06-25 DIAGNOSIS — Z01419 Encounter for gynecological examination (general) (routine) without abnormal findings: Secondary | ICD-10-CM | POA: Diagnosis not present

## 2023-06-25 DIAGNOSIS — Z1231 Encounter for screening mammogram for malignant neoplasm of breast: Secondary | ICD-10-CM | POA: Diagnosis not present

## 2023-10-02 ENCOUNTER — Encounter: Payer: Self-pay | Admitting: Family Medicine

## 2023-12-18 ENCOUNTER — Encounter: Payer: Self-pay | Admitting: Internal Medicine

## 2023-12-18 ENCOUNTER — Ambulatory Visit: Payer: 59 | Admitting: Internal Medicine

## 2023-12-18 VITALS — BP 120/70 | HR 63 | Ht 61.0 in | Wt 150.8 lb

## 2023-12-18 DIAGNOSIS — E890 Postprocedural hypothyroidism: Secondary | ICD-10-CM

## 2023-12-18 DIAGNOSIS — C73 Malignant neoplasm of thyroid gland: Secondary | ICD-10-CM | POA: Diagnosis not present
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
## 2023-12-18 NOTE — Patient Instructions (Signed)
Please stop at the lab.  Please continue Levothyroxine 88 mcg daily.  Take the thyroid hormone every day, with water, at least 30 minutes before breakfast, separated by at least 4 hours from: - acid reflux medications - calcium - iron - multivitamins  Please come back for a follow-up appointment in 1 year. 

## 2023-12-19 LAB — THYROGLOBULIN ANTIBODY: Thyroglobulin Ab: <1 [IU]/mL (ref <=1)

## 2023-12-19 LAB — THYROGLOBULIN LEVEL: Thyroglobulin: 0.3 ng/mL — ABNORMAL LOW

## 2023-12-19 LAB — TSH: TSH: 2.57 m[IU]/L

## 2023-12-19 LAB — T4, FREE: Free T4: 1.6 ng/dL (ref 0.8–1.8)

## 2023-12-21 ENCOUNTER — Ambulatory Visit: Payer: Self-pay | Admitting: Internal Medicine

## 2023-12-21 DIAGNOSIS — E890 Postprocedural hypothyroidism: Secondary | ICD-10-CM

## 2023-12-21 MED ORDER — LEVOTHYROXINE SODIUM 100 MCG PO TABS: 100.0000 ug | ORAL_TABLET | Freq: Every day | ORAL | 5 refills | Status: DC

## 2023-12-21 NOTE — Addendum Note (Signed)
 Addended by: Emilie Harden on: 12/21/2023 09:10 AM   Modules accepted: Orders

## 2024-01-01 DIAGNOSIS — E890 Postprocedural hypothyroidism: Secondary | ICD-10-CM | POA: Diagnosis not present

## 2024-01-01 DIAGNOSIS — E785 Hyperlipidemia, unspecified: Secondary | ICD-10-CM | POA: Diagnosis not present

## 2024-01-01 DIAGNOSIS — Z79890 Hormone replacement therapy (postmenopausal): Secondary | ICD-10-CM | POA: Diagnosis not present

## 2024-01-01 DIAGNOSIS — R030 Elevated blood-pressure reading, without diagnosis of hypertension: Secondary | ICD-10-CM | POA: Diagnosis not present

## 2024-02-04 ENCOUNTER — Encounter: Payer: Self-pay | Admitting: Family

## 2024-02-04 ENCOUNTER — Ambulatory Visit (INDEPENDENT_AMBULATORY_CARE_PROVIDER_SITE_OTHER): Admitting: Family

## 2024-02-04 VITALS — BP 115/81 | HR 90 | Temp 98.5°F | Resp 16 | Ht 61.0 in | Wt 148.0 lb

## 2024-02-04 DIAGNOSIS — R21 Rash and other nonspecific skin eruption: Secondary | ICD-10-CM

## 2024-02-04 DIAGNOSIS — R109 Unspecified abdominal pain: Secondary | ICD-10-CM | POA: Diagnosis not present

## 2024-02-04 MED ORDER — TRIAMCINOLONE ACETONIDE 0.025 % EX CREA: 1.0000 | TOPICAL_CREAM | Freq: Two times a day (BID) | CUTANEOUS | 2 refills | Status: DC

## 2024-02-04 MED ORDER — SUCRALFATE 1 G PO TABS
1.0000 g | ORAL_TABLET | Freq: Two times a day (BID) | ORAL | 0 refills | Status: DC | PRN
Start: 2024-02-04 — End: 2024-03-10
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
## 2024-03-04 ENCOUNTER — Encounter: Payer: Self-pay | Admitting: Family Medicine

## 2024-03-04 ENCOUNTER — Ambulatory Visit (INDEPENDENT_AMBULATORY_CARE_PROVIDER_SITE_OTHER): Admitting: Family Medicine

## 2024-03-04 VITALS — BP 125/83 | HR 60 | Ht 61.0 in | Wt 151.8 lb

## 2024-03-04 DIAGNOSIS — K219 Gastro-esophageal reflux disease without esophagitis: Secondary | ICD-10-CM | POA: Diagnosis not present
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
## 2024-03-10 ENCOUNTER — Encounter: Payer: Self-pay | Admitting: Family Medicine

## 2024-03-11 ENCOUNTER — Encounter: Payer: Self-pay | Admitting: Family Medicine

## 2024-03-27 ENCOUNTER — Other Ambulatory Visit

## 2024-03-27 ENCOUNTER — Encounter: Payer: Self-pay | Admitting: Physician Assistant

## 2024-03-27 DIAGNOSIS — E890 Postprocedural hypothyroidism: Secondary | ICD-10-CM | POA: Diagnosis not present

## 2024-03-27 DIAGNOSIS — F4312 Post-traumatic stress disorder, chronic: Secondary | ICD-10-CM | POA: Diagnosis not present

## 2024-03-27 DIAGNOSIS — C73 Malignant neoplasm of thyroid gland: Secondary | ICD-10-CM | POA: Diagnosis not present

## 2024-03-28 LAB — THYROGLOBULIN LEVEL: Thyroglobulin: 0.2 ng/mL — ABNORMAL LOW

## 2024-03-28 LAB — THYROGLOBULIN ANTIBODY: Thyroglobulin Ab: <1 [IU]/mL (ref <=1)

## 2024-03-28 LAB — TSH: TSH: 0.09 m[IU]/L — ABNORMAL LOW

## 2024-03-28 MED ORDER — LEVOTHYROXINE SODIUM 88 MCG PO TABS: 88.0000 ug | ORAL_TABLET | ORAL | 3 refills | Status: DC

## 2024-03-28 MED ORDER — LEVOTHYROXINE SODIUM 100 MCG PO TABS: 100.0000 ug | ORAL_TABLET | ORAL | 3 refills | Status: AC

## 2024-03-28 NOTE — Addendum Note (Signed)
 Addended by: TRIXIE FILE on: 03/28/2024 11:07 AM   Modules accepted: Orders

## 2024-03-31 MED ORDER — LEVOTHYROXINE SODIUM 88 MCG PO TABS
88.0000 ug | ORAL_TABLET | ORAL | Status: DC
Start: 2024-03-31 — End: 2024-05-07

## 2024-03-31 NOTE — Addendum Note (Signed)
 Addended by: ARLOA GAUZE N on: 03/31/2024 09:00 AM   Modules accepted: Orders

## 2024-04-03 ENCOUNTER — Emergency Department (HOSPITAL_COMMUNITY)
Admission: EM | Admit: 2024-04-03 | Discharge: 2024-04-03 | Disposition: A | Discharge status: Home / Self Care | Attending: Emergency Medicine | Admitting: Emergency Medicine

## 2024-04-03 ENCOUNTER — Encounter (HOSPITAL_COMMUNITY): Payer: Self-pay

## 2024-04-03 ENCOUNTER — Emergency Department (HOSPITAL_COMMUNITY)

## 2024-04-03 DIAGNOSIS — E039 Hypothyroidism, unspecified: Secondary | ICD-10-CM | POA: Diagnosis not present

## 2024-04-03 DIAGNOSIS — Z79899 Other long term (current) drug therapy: Secondary | ICD-10-CM | POA: Diagnosis not present

## 2024-04-03 DIAGNOSIS — R0789 Other chest pain: Secondary | ICD-10-CM | POA: Diagnosis not present

## 2024-04-03 DIAGNOSIS — R072 Precordial pain: Secondary | ICD-10-CM | POA: Diagnosis not present

## 2024-04-03 DIAGNOSIS — R079 Chest pain, unspecified: Secondary | ICD-10-CM | POA: Diagnosis not present

## 2024-04-03 LAB — CBC WITH DIFFERENTIAL/PLATELET
Abs Immature Granulocytes: 0.01 K/uL (ref 0.00–0.07)
Basophils Absolute: 0 K/uL (ref 0.0–0.1)
Basophils Relative: 1 %
Eosinophils Absolute: 0.1 K/uL (ref 0.0–0.5)
Eosinophils Relative: 2 %
HCT: 42.1 % (ref 36.0–46.0)
Hemoglobin: 13.1 g/dL (ref 12.0–15.0)
Immature Granulocytes: 0 %
Lymphocytes Relative: 46 %
Lymphs Abs: 2 K/uL (ref 0.7–4.0)
MCH: 27.9 pg (ref 26.0–34.0)
MCHC: 31.1 g/dL (ref 30.0–36.0)
MCV: 89.6 fL (ref 80.0–100.0)
Monocytes Absolute: 0.4 K/uL (ref 0.1–1.0)
Monocytes Relative: 9 %
Neutro Abs: 1.8 K/uL (ref 1.7–7.7)
Neutrophils Relative %: 42 %
Platelets: 251 K/uL (ref 150–400)
RBC: 4.7 MIL/uL (ref 3.87–5.11)
RDW: 14.1 % (ref 11.5–15.5)
WBC: 4.4 K/uL (ref 4.0–10.5)
nRBC: 0 % (ref 0.0–0.2)

## 2024-04-03 LAB — COMPREHENSIVE METABOLIC PANEL WITH GFR
ALT: 11 U/L (ref 0–44)
AST: 21 U/L (ref 15–41)
Albumin: 4.6 g/dL (ref 3.5–5.0)
Alkaline Phosphatase: 75 U/L (ref 38–126)
Anion gap: 11 (ref 5–15)
BUN: 7 mg/dL (ref 6–20)
CO2: 23 mmol/L (ref 22–32)
Calcium: 9.3 mg/dL (ref 8.9–10.3)
Chloride: 108 mmol/L (ref 98–111)
Creatinine, Ser: 0.75 mg/dL (ref 0.44–1.00)
GFR, Estimated: >60 mL/min (ref >60)
Glucose, Bld: 90 mg/dL (ref 70–99)
Potassium: 3.7 mmol/L (ref 3.5–5.1)
Sodium: 141 mmol/L (ref 135–145)
Total Bilirubin: 0.5 mg/dL (ref 0.0–1.2)
Total Protein: 7.6 g/dL (ref 6.5–8.1)

## 2024-04-03 LAB — D-DIMER, QUANTITATIVE: D-Dimer, Quant: <0.27 ug{FEU}/mL (ref 0.00–0.50)

## 2024-04-03 LAB — TROPONIN T, HIGH SENSITIVITY: Troponin T High Sensitivity: <15 ng/L (ref 0–19)

## 2024-04-03 NOTE — ED Provider Notes (Signed)
 [place] EMERGENCY DEPARTMENT AT [facility] Provider Note   CSN: [id number] Arrival date & time: 04/03/24  1345     Patient presents with: Chest Pain   Sharon Moreno is a 53 y.o. female with history of hypothyroidism, presents with concern for chest pain that started yesterday after eating dinner.  She reports the pain seem to be in the central area of her chest after she ate.  The pain seemed to go away overnight, but returned in the morning.  Pain lasts about 2 to 3 minutes at a time.  Pain is not exertional.  She denies any pleuritic chest pain or shortness of breath.  Denies any fever, cough, chills.  Denies any lower extremity pain or swelling.  Denies any recent long plane or car rides, recent hospitalizations or surgeries, any estrogen use, or any history of blood clot.    Chest Pain      Prior to Admission medications   Medication Sig Start Date End Date Taking? Authorizing Provider  fluticasone (FLONASE) 50 MCG/ACT nasal spray Place into the nose. Patient not taking: Reported on 02/04/2024 04/11/22 05/15/23  [provider]  levothyroxine  (SYNTHROID ) 100 MCG tablet Take 1 tablet (100 mcg total) by mouth every other day. 03/28/24   Trixie File, MD  levothyroxine  (SYNTHROID ) 88 MCG tablet Take 1 tablet (88 mcg total) by mouth every other day. 03/31/24   Trixie File, MD  triamcinolone  (KENALOG ) 0.025 % cream Apply 1 Application topically 2 (two) times daily. 02/04/24   Lorren Greig PARAS, NP    Allergies: Patient has no known allergies.    Review of Systems  Cardiovascular:  Positive for chest pain.    Updated Vital Signs BP 125/88 (BP Location: Left Arm)   Pulse (!) 58   Temp 98.5 F (36.9 C) (Oral)   Resp 16   LMP 09/30/2013   SpO2 99%   Physical Exam Vitals and nursing note reviewed.  Constitutional:      General: She is not in acute distress.    Appearance: She is well-developed.  HENT:     Head: Normocephalic and atraumatic.   Eyes:     Conjunctiva/sclera: Conjunctivae normal.  Cardiovascular:     Rate and Rhythm: Normal rate and regular rhythm.     Heart sounds: No murmur heard.    Comments: 2+ radial and pedal pulses bilaterally Pulmonary:     Effort: Pulmonary effort is normal. No respiratory distress.     Breath sounds: Normal breath sounds.  Abdominal:     Palpations: Abdomen is soft.     Tenderness: There is no abdominal tenderness.  Musculoskeletal:        General: No swelling.     Cervical back: Neck supple.     Right lower leg: No edema.     Left lower leg: No edema.  Skin:    General: Skin is warm and dry.     Capillary Refill: Capillary refill takes less than 2 seconds.  Neurological:     Mental Status: She is alert.  Psychiatric:        Mood and Affect: Mood normal.     (all labs ordered are listed, but only abnormal results are displayed) Labs Reviewed  CBC WITH DIFFERENTIAL/PLATELET  COMPREHENSIVE METABOLIC PANEL WITH GFR  D-DIMER, QUANTITATIVE  TROPONIN T, HIGH SENSITIVITY    EKG: None  Radiology: DG Chest 2 View Result Date: 04/03/2024 CLINICAL DATA:  chest pain EXAM: CHEST - 2 VIEW COMPARISON:  11/11/2021 FINDINGS: No  focal airspace consolidation, pleural effusion, or pneumothorax. No cardiomegaly. No acute fracture or destructive lesions. IMPRESSION: No acute cardiopulmonary abnormality. Electronically Signed   By: Rogelia Myers M.D.   On: 04/03/2024 16:13     Procedures   Medications Ordered in the ED - No data to display              HEART Score: 1                    Medical Decision Making Amount and/or Complexity of Data Reviewed Labs: ordered. Radiology: ordered.     Differential diagnosis includes but is not limited to ACS, arrhythmia, aortic aneurysm, pericarditis, myocarditis, pericardial effusion, cardiac tamponade, musculoskeletal pain, GERD, Boerhaave's syndrome, DVT/PE, pneumonia, pleural effusion   ED Course:  Upon initial evaluation,  patient very well-appearing, no acute distress.  Stable vitals.  Lungs clear to auscultation bilaterally.  Cardiac auscultation with regular rate and rhythm and no murmurs appreciated.  No lower extremity edema or calf tenderness to palpation.  Radial and pedal pulses 2+ bilaterally.  She declines any pain medicine at this time.   Labs Ordered: I Ordered, and personally interpreted labs.  The pertinent results include:   CBC within normal limits CMP within normal limits D-dimer within normal limits Troponin under 15 x 2  Imaging Studies ordered: I ordered imaging studies including chest x-ray I independently visualized the imaging with scope of interpretation limited to determining acute life threatening conditions related to emergency care. Imaging showed no acute abnormalities I agree with the radiologist interpretation   Cardiac Monitoring: / EKG: The patient was maintained on a cardiac monitor.  I personally viewed and interpreted the cardiac monitored which showed an underlying rhythm of: Alternating between sinus bradycardia and normal sinus rhythm (heart rate around 55-60) which appears to be at baseline per previous outpatient visits and vitals    Medications Given: None  Upon re-evaluation, patient remains well appearing with stable vitals. No worsening of chest pain or any new symptoms.  Labs reassuring with CBC and CMP within normal limits.  Low concern for ACS at this time given troponin less than 15 and pain ongoing for over 24 hours, pain non-exertional, and EKG with normal sinus rhythm and no ST changes. HEART score of 1 due to age. Chest x-ray without any acute abnormality. Low concern for DVT or PE at this time given d-dimer within normal limits, no pleuritic pain.  Given pain started after food intake, question GERD or GI pathology. Feel she is stable and appropriate for discharge home    Impression: Atypical chest pain  Disposition:  The patient was discharged home  with instructions to follow-up with PCP within the next 2 weeks regarding her pain today. Return precautions given and patient verbalized understanding    This chart was dictated using voice recognition software, Dragon. Despite the best efforts of this provider to proofread and correct errors, errors may still occur which can change documentation meaning.       Final diagnoses:  Atypical chest pain    ED Discharge Orders     None          Veta Palma, [place] 04/03/24 1749    Randol Simmonds, MD 04/04/24 [phone]

## 2024-04-03 NOTE — Discharge Instructions (Signed)
 Your workup today is reassuring. It is very unlikely that your pain is due to an issue in your heart.  Your cardiac enzyme (troponin) was normal today. Your EKG which is a measure of the heart's electrical activity and rhythm is normal today. These would both show abnormalities if you were having a heart attack.  Your blood test to look for signs of a blood clot is normal.  Your chest x-ray is normal today.  Return to the ER if you have any shortness of breath, difficulty breathing, worsening chest pain, dizziness, jaw pain, left arm or shoulder pain, abdominal pain, unexplained fever, any other new or concerning symptoms.

## 2024-04-03 NOTE — ED Triage Notes (Signed)
 CP starting last night. Intermittent - central but more so on the left side.Sent here by UC - for concerning EKG with bradycardia at 45 and t-wave inversion. Pain worsens with deep breaths.

## 2024-04-18 ENCOUNTER — Encounter: Admitting: Family Medicine

## 2024-05-05 ENCOUNTER — Other Ambulatory Visit: Payer: Self-pay | Admitting: Internal Medicine

## 2024-05-05 DIAGNOSIS — E890 Postprocedural hypothyroidism: Secondary | ICD-10-CM

## 2024-05-07 NOTE — Telephone Encounter (Signed)
 Requested Prescriptions   Pending Prescriptions Disp Refills   levothyroxine  (SYNTHROID ) 88 MCG tablet [Pharmacy Med Name: Levothyroxine  Sodium 88 MCG Oral Tablet] 30 tablet 0    Sig: Take 1 tablet by mouth once daily

## 2024-05-19 NOTE — Progress Notes (Unsigned)
 05/20/2024 Sharon Moreno [id number] [birth year]  Referring provider: Tanda Bleacher, MD Primary GI doctor: Dr. San  ASSESSMENT AND PLAN:  GERD/atypical chest and periumblical pain with nausea/regurgitation with pink/blood x 1-2 years with cough too, can be worse with food, having throat irritation 04/03/2024 troponin EKG negative recent ER visit negative D-dimer Really uncertain if this is completely GI but does have some with sounds like LPR symptoms can be worse with food but also can have a cough in the morning with pink or what looks like blood in it recent chest pain and some shortness of breath atypical chest pain we will rule out H. pylori, inflammation, constipation with upper GI symptoms, consider abdominal imaging, will get CT a chest for abnormal CT 2020 with current symptoms and scheduled for EGD afterwards. -Get Sed rate, CRP, celiac, hpylori - KUB to evaluate for constipation -Pending labs, will schedule for CT AB and pelvis versus US , no AB pain on palpation -Schedule Egd to evaluate for esophagitis, H pylori, etc I discussed risks of EGD with patient today, including risk of sedation, bleeding or perforation.  Patient provides understanding and gave verbal consent to proceed. -Consider SIBO testing or xifaxin trial pending results -Can do trial of IBGARD daily -FODMAP,  and lifestyle changes discussed -consider evaluation with ENT for symptoms with tinnitus/ET dysfunction   Screening colonoscopy  At [facility] 2021, recall 10 years Will get records  Personal history of thyroid  cancer Papillary cancer s/p thyroidectomy   Ectatic distal aortic arch seen on CT head/neck 2020 Recommend follow up chest CTA never done With recent chest pain, SOB, questionable hemoptysis, will get CTA chest to evaluate ER precautions discussed  Patient Care Team: Tanda Bleacher, MD as PCP - General (Family Medicine)  HISTORY OF PRESENT ILLNESS: 53 y.o. female with a past  medical history listed below presents for evaluation of nausea.   Discussed the use of AI scribe software for clinical note transcription with the patient, who gave verbal consent to proceed.  History of Present Illness   Sharon Moreno is a 53 year old female who presents with gastrointestinal symptoms including nausea and regurgitation with blood. She was referred by Dr. Tanda for evaluation of gastrointestinal symptoms.  She has been experiencing gastrointestinal symptoms intermittently for about one to two years, including nausea and regurgitation with pink or blood-tinged material. These episodes occur sporadically, sometimes daily for a week, then resolving for a couple of weeks before recurring. The symptoms are not consistently associated with food intake, bowel movements, or specific foods, although she noted improvement after discontinuing leafy greens in smoothies. Discomfort is described around the periumbilical area, with no significant heartburn or reflux, but with nausea and some discomfort. She sometimes experiences throat irritation and mucus production, particularly at night.  She has a history of thyroid  cancer, for which her thyroid  was removed in her late twenties. She does not take any prescription medications except for thyroid  medication. She denies any use of NSAIDs or other over-the-counter pain medications. She has experienced weight loss, which she attributes to intentional lifestyle changes including diet and exercise since her retirement.  She reports occasional tinnitus, which she initially thought was related to environmental noise but persists even in quiet settings. She also experiences mucus production and a sensation of fluid in her ears, which began after a nodule removal from her chin area.  No significant changes in bowel habits, maintaining a regular daily bowel movement pattern, though she notes occasional variations in  stool consistency. No melena, significant  gas, or bloating. She has experienced some soreness in the abdominal area, particularly when dehydrated, which improves with water intake.  She has a history of a colonoscopy in 2021. She had an ER visit in October for chest pain, where an EKG and D-dimer were normal. She describes the chest pain as pleuritic, occurring with deep breaths, and accompanied by a sore sensation.        She  reports that she has never smoked. She has never used smokeless tobacco. She reports that she does not drink alcohol and does not use drugs.  RELEVANT GI HISTORY, IMAGING AND LABS: Results   LABS D-dimer: Negative (04/2024)  RADIOLOGY Chest X-ray: Normal (04/2024) CT Soft Tissue Neck: Irregular soft tissue nodule in left parotid space (2020) Abdominal Ultrasound: Normal gallbladder (2011)  DIAGNOSTIC EKG: Normal (04/2024)      CBC    Component Value Date/Time   WBC 4.4 04/03/2024 1404   RBC 4.70 04/03/2024 1404   HGB 13.1 04/03/2024 1404   HCT 42.1 04/03/2024 1404   PLT 251 04/03/2024 1404   MCV 89.6 04/03/2024 1404   MCH 27.9 04/03/2024 1404   MCHC 31.1 04/03/2024 1404   RDW 14.1 04/03/2024 1404   LYMPHSABS 2.0 04/03/2024 1404   MONOABS 0.4 04/03/2024 1404   EOSABS 0.1 04/03/2024 1404   BASOSABS 0.0 04/03/2024 1404   Recent Labs    04/03/24 1404  HGB 13.1    CMP     Component Value Date/Time   NA 141 04/03/2024 1404   K 3.7 04/03/2024 1404   CL 108 04/03/2024 1404   CO2 23 04/03/2024 1404   GLUCOSE 90 04/03/2024 1404   BUN 7 04/03/2024 1404   CREATININE 0.75 04/03/2024 1404   CALCIUM 9.3 04/03/2024 1404   PROT 7.6 04/03/2024 1404   ALBUMIN 4.6 04/03/2024 1404   AST 21 04/03/2024 1404   ALT 11 04/03/2024 1404   ALKPHOS 75 04/03/2024 1404   BILITOT 0.5 04/03/2024 1404   GFRNONAA >60 04/03/2024 1404      Latest Ref Rng & Units 04/03/2024    2:04 PM  Hepatic Function  Total Protein 6.5 - 8.1 g/dL 7.6   Albumin 3.5 - 5.0 g/dL 4.6   AST 15 - 41 U/L 21   ALT 0 - 44 U/L  11   Alk Phosphatase 38 - 126 U/L 75   Total Bilirubin 0.0 - 1.2 mg/dL 0.5      Current Medications:   Current Outpatient Medications (Endocrine & Metabolic):    levothyroxine  (SYNTHROID ) 100 MCG tablet, Take 1 tablet (100 mcg total) by mouth every other day.   levothyroxine  (SYNTHROID ) 88 MCG tablet, Take 1 tablet by mouth once daily (Patient taking differently: Take 88 mcg by mouth every other day.)   Current Outpatient Medications (Respiratory):    fluticasone (FLONASE) 50 MCG/ACT nasal spray, Place 1 spray into the nose as needed.     Medical History:  Past Medical History:  Diagnosis Date   Eczema    Hypothyroidism    Allergies: No Known Allergies   Surgical History:  She  has a past surgical history that includes Tonsillectomy; Thyroidectomy; Cesarean section; and Parotidectomy (Left, 12/19/2019). Family History:  Her family history includes Dementia in her paternal grandmother; Diabetes in her maternal aunt, maternal grandfather, and mother; Hypertension in her father and maternal grandfather.  REVIEW OF SYSTEMS  : All other systems reviewed and negative except where noted in the History of Present Illness.  PHYSICAL EXAM: BP (!) 144/78 (BP Location: Left Arm, Patient Position: Sitting, Cuff Size: Normal)   Pulse 86   Ht 5' 0.5 (1.537 m) Comment: height measured without shoes  Wt 148 lb (67.1 kg)   LMP 09/30/2013   BMI 28.43 kg/m  Physical Exam   GENERAL APPEARANCE: Well nourished, in no apparent distress HEENT: No cervical lymphadenopathy, unremarkable thyroid , sclerae anicteric, conjunctiva pink RESPIRATORY: Respiratory effort normal, BS equal bilateral without rales, rhonchi, wheezing CARDIO: RRR with no MRGs, peripheral pulses intact ABDOMEN: Soft, non distended, active bowel sounds in all 4 quadrants, no tenderness to palpation, no rebound, no mass appreciated RECTAL: declines MUSCULOSKELETAL: Full ROM, normal gait, without edema SKIN: Dry, intact without  rashes or lesions. No jaundice. NEURO: Alert, oriented, no focal deficits PSYCH: Cooperative, normal mood and affect.      Alan JONELLE Coombs, PA-C 12:54 PM

## 2024-05-20 ENCOUNTER — Ambulatory Visit: Admitting: Physician Assistant

## 2024-05-20 ENCOUNTER — Other Ambulatory Visit (INDEPENDENT_AMBULATORY_CARE_PROVIDER_SITE_OTHER)

## 2024-05-20 ENCOUNTER — Ambulatory Visit (INDEPENDENT_AMBULATORY_CARE_PROVIDER_SITE_OTHER)
Admission: RE | Admit: 2024-05-20 | Discharge: 2024-05-20 | Disposition: A | Discharge status: Home / Self Care | Source: Ambulatory Visit | Attending: Physician Assistant | Admitting: Physician Assistant

## 2024-05-20 ENCOUNTER — Ambulatory Visit: Payer: Self-pay | Admitting: Physician Assistant

## 2024-05-20 ENCOUNTER — Encounter: Payer: Self-pay | Admitting: Physician Assistant

## 2024-05-20 VITALS — BP 144/78 | HR 86 | Ht 60.5 in | Wt 148.0 lb

## 2024-05-20 DIAGNOSIS — R1033 Periumbilical pain: Secondary | ICD-10-CM

## 2024-05-20 DIAGNOSIS — K219 Gastro-esophageal reflux disease without esophagitis: Secondary | ICD-10-CM | POA: Diagnosis not present

## 2024-05-20 DIAGNOSIS — K5900 Constipation, unspecified: Secondary | ICD-10-CM | POA: Diagnosis not present

## 2024-05-20 DIAGNOSIS — R079 Chest pain, unspecified: Secondary | ICD-10-CM

## 2024-05-20 DIAGNOSIS — Q254 Congenital malformation of aorta unspecified: Secondary | ICD-10-CM

## 2024-05-20 DIAGNOSIS — R110 Nausea: Secondary | ICD-10-CM

## 2024-05-20 LAB — COMPREHENSIVE METABOLIC PANEL WITH GFR
ALT: 9 U/L (ref 0–35)
AST: 15 U/L (ref 0–37)
Albumin: 4.4 g/dL (ref 3.5–5.2)
Alkaline Phosphatase: 61 U/L (ref 39–117)
BUN: 11 mg/dL (ref 6–23)
CO2: 26 meq/L (ref 19–32)
Calcium: 9 mg/dL (ref 8.4–10.5)
Chloride: 107 meq/L (ref 96–112)
Creatinine, Ser: 0.7 mg/dL (ref 0.40–1.20)
GFR: 97.61 mL/min (ref >60.00)
Glucose, Bld: 72 mg/dL (ref 70–99)
Potassium: 4 meq/L (ref 3.5–5.1)
Sodium: 140 meq/L (ref 135–145)
Total Bilirubin: 0.6 mg/dL (ref 0.2–1.2)
Total Protein: 7.5 g/dL (ref 6.0–8.3)

## 2024-05-20 LAB — CBC WITH DIFFERENTIAL/PLATELET
Basophils Absolute: 0 K/uL (ref 0.0–0.1)
Basophils Relative: 0.8 % (ref 0.0–3.0)
Eosinophils Absolute: 0.1 K/uL (ref 0.0–0.7)
Eosinophils Relative: 1.2 % (ref 0.0–5.0)
HCT: 41.5 % (ref 36.0–46.0)
Hemoglobin: 13.7 g/dL (ref 12.0–15.0)
Lymphocytes Relative: 33 % (ref 12.0–46.0)
Lymphs Abs: 1.7 K/uL (ref 0.7–4.0)
MCHC: 32.9 g/dL (ref 30.0–36.0)
MCV: 87.4 fl (ref 78.0–100.0)
Monocytes Absolute: 0.5 K/uL (ref 0.1–1.0)
Monocytes Relative: 10.5 % (ref 3.0–12.0)
Neutro Abs: 2.8 K/uL (ref 1.4–7.7)
Neutrophils Relative %: 54.5 % (ref 43.0–77.0)
Platelets: 245 K/uL (ref 150.0–400.0)
RBC: 4.75 Mil/uL (ref 3.87–5.11)
RDW: 14.7 % (ref 11.5–15.5)
WBC: 5.1 K/uL (ref 4.0–10.5)

## 2024-05-20 LAB — SEDIMENTATION RATE: Sed Rate: 17 mm/h (ref 0–30)

## 2024-05-20 LAB — C-REACTIVE PROTEIN: CRP: <0.5 mg/dL (ref 0.5–20.0)

## 2024-05-20 NOTE — Patient Instructions (Addendum)
 Your provider has requested that you go to the basement level for lab work before leaving today. Press B on the elevator. The lab is located at the first door on the left as you exit the elevator.  Your provider has requested that you have an abdominal x ray before leaving today. Please go to the basement floor to our Radiology department for the test.  You have been scheduled for a CT Angio Chest scan at [facility], 1st floor Radiology. You are scheduled on 06/03/24 at 1:30 pm. You should arrive 15 minutes prior to your appointment time for registration. If you have any questions regarding your exam or if you need to reschedule, you may call [facility] at [phone] between the hours of 8:00 am and 5:00 pm, Monday-Friday.   You have been scheduled for an endoscopy. Please follow written instructions given to you at your visit today.  If you use inhalers (even only as needed), please bring them with you on the day of your procedure.  If you take any of the following medications, they will need to be adjusted prior to your procedure:   DO NOT TAKE 7 DAYS PRIOR TO TEST- Trulicity (dulaglutide) Ozempic, Wegovy (semaglutide) Mounjaro, Zepbound (tirzepatide) Bydureon Bcise (exanatide extended release)  DO NOT TAKE 1 DAY PRIOR TO YOUR TEST Rybelsus (semaglutide) Adlyxin (lixisenatide) Victoza (liraglutide) Byetta (exanatide) ___________________________________________________________________________    Avoid fatty foods Limit your intake of coffee, tea, alcohol, and carbonated drinks Work to maintain a healthy weight Keep the head of the bed elevated at least 3 inches with blocks or a wedge pillow if you are having any nighttime symptoms Stay upright for 2 hours after eating Avoid meals and snacks three to four hours before bedtime

## 2024-05-21 LAB — TISSUE TRANSGLUTAMINASE, IGA: (tTG) Ab, IgA: <1 U/mL

## 2024-05-21 LAB — IGA: Immunoglobulin A: 262 mg/dL (ref 47–310)

## 2024-05-21 NOTE — Progress Notes (Signed)
 Agree with the assessment and plan as outlined by Alan Coombs, PA-C.   Agree that this is difficult to parse out whether or not symptoms are truly GI in origin or if the GI symptoms are mere manifestation of another etiology.  I think starting with labs to include inflammatory markers, H. pylori, and celiac is very reasonable.  EGD also reasonable to evaluate for erosive esophagitis, PUD, gastritis.  If labs and KUB and EGD otherwise unremarkable, CT A/P to evaluate for extraluminal pathology.  If all unremarkable, ENT and/or pulmonary referral.  Sandor Flatter, DO, FACG

## 2024-05-26 ENCOUNTER — Encounter: Payer: Self-pay | Admitting: Physician Assistant

## 2024-05-26 NOTE — Addendum Note (Signed)
 Addended by: CRAIG PALMA on: 05/26/2024 12:25 PM   Modules accepted: Orders

## 2024-06-02 ENCOUNTER — Encounter: Payer: Self-pay | Admitting: Physician Assistant

## 2024-06-03 ENCOUNTER — Ambulatory Visit (HOSPITAL_COMMUNITY)

## 2024-06-05 ENCOUNTER — Inpatient Hospital Stay
Admission: RE | Admit: 2024-06-05 | Discharge: 2024-06-05 | Attending: Physician Assistant | Admitting: Physician Assistant

## 2024-06-05 DIAGNOSIS — R079 Chest pain, unspecified: Secondary | ICD-10-CM

## 2024-06-05 MED ORDER — IOPAMIDOL (ISOVUE-370) INJECTION 76%
95.0000 mL | Freq: Once | INTRAVENOUS | Status: AC | PRN
  Administered 2024-06-05: 95 mL via INTRAVENOUS

## 2024-06-06 ENCOUNTER — Encounter: Payer: Self-pay | Admitting: Gastroenterology

## 2024-06-06 DIAGNOSIS — H9313 Tinnitus, bilateral: Secondary | ICD-10-CM | POA: Diagnosis not present

## 2024-06-06 DIAGNOSIS — R051 Acute cough: Secondary | ICD-10-CM | POA: Diagnosis not present

## 2024-06-13 ENCOUNTER — Encounter: Admitting: Gastroenterology

## 2024-07-11 ENCOUNTER — Encounter: Admitting: Gastroenterology

## 2024-07-23 ENCOUNTER — Encounter: Admitting: Family Medicine

## 2024-08-07 ENCOUNTER — Other Ambulatory Visit: Payer: Self-pay | Admitting: Internal Medicine

## 2024-08-07 DIAGNOSIS — E890 Postprocedural hypothyroidism: Secondary | ICD-10-CM

## 2024-08-19 ENCOUNTER — Encounter: Admitting: Family Medicine

## 2024-08-25 ENCOUNTER — Encounter: Admitting: Gastroenterology

## 2024-12-17 ENCOUNTER — Ambulatory Visit: Admitting: Internal Medicine
